# Patient Record
Sex: Male | Born: 1970 | Race: White | Hispanic: No | Marital: Single | Smoking: Current every day smoker
Health system: Southern US, Community
[De-identification: ages and names within clinical notes are randomized; demographics above are authoritative.]

## PROBLEM LIST (undated history)

## (undated) DIAGNOSIS — F419 Anxiety disorder, unspecified: Secondary | ICD-10-CM

## (undated) DIAGNOSIS — G2581 Restless legs syndrome: Secondary | ICD-10-CM

## (undated) DIAGNOSIS — F1010 Alcohol abuse, uncomplicated: Secondary | ICD-10-CM

## (undated) DIAGNOSIS — F4310 Post-traumatic stress disorder, unspecified: Secondary | ICD-10-CM

## (undated) DIAGNOSIS — R569 Unspecified convulsions: Secondary | ICD-10-CM

## (undated) DIAGNOSIS — F32A Depression, unspecified: Secondary | ICD-10-CM

## (undated) DIAGNOSIS — F329 Major depressive disorder, single episode, unspecified: Secondary | ICD-10-CM

## (undated) DIAGNOSIS — G4730 Sleep apnea, unspecified: Secondary | ICD-10-CM

## (undated) DIAGNOSIS — G4700 Insomnia, unspecified: Secondary | ICD-10-CM

## (undated) DIAGNOSIS — K259 Gastric ulcer, unspecified as acute or chronic, without hemorrhage or perforation: Secondary | ICD-10-CM

## (undated) HISTORY — PX: ABDOMINAL SURGERY: SHX537

## (undated) HISTORY — PX: KNEE SURGERY: SHX244

## (undated) HISTORY — PX: GANGLION CYST EXCISION: SHX1691

## (undated) HISTORY — DX: Alcohol abuse, uncomplicated: F10.10

---

## 1994-07-23 DIAGNOSIS — K259 Gastric ulcer, unspecified as acute or chronic, without hemorrhage or perforation: Secondary | ICD-10-CM

## 1994-07-23 HISTORY — PX: ABDOMINAL SURGERY: SHX537

## 1994-07-23 HISTORY — DX: Gastric ulcer, unspecified as acute or chronic, without hemorrhage or perforation: K25.9

## 2016-12-18 HISTORY — PX: EUS: SHX5427

## 2016-12-18 HISTORY — PX: ESOPHAGOGASTRODUODENOSCOPY: SHX1529

## 2018-05-03 ENCOUNTER — Emergency Department (HOSPITAL_COMMUNITY)
Admission: EM | Admit: 2018-05-03 | Discharge: 2018-05-03 | Disposition: A | Discharge status: Home / Self Care | Payer: Medicare Other | Attending: Emergency Medicine | Admitting: Emergency Medicine

## 2018-05-03 ENCOUNTER — Emergency Department (HOSPITAL_COMMUNITY): Payer: Medicare Other

## 2018-05-03 ENCOUNTER — Encounter (HOSPITAL_COMMUNITY): Payer: Self-pay | Admitting: Emergency Medicine

## 2018-05-03 ENCOUNTER — Other Ambulatory Visit: Payer: Self-pay

## 2018-05-03 DIAGNOSIS — F419 Anxiety disorder, unspecified: Secondary | ICD-10-CM | POA: Diagnosis present

## 2018-05-03 DIAGNOSIS — G2581 Restless legs syndrome: Secondary | ICD-10-CM | POA: Diagnosis not present

## 2018-05-03 DIAGNOSIS — F329 Major depressive disorder, single episode, unspecified: Secondary | ICD-10-CM | POA: Insufficient documentation

## 2018-05-03 DIAGNOSIS — Z87891 Personal history of nicotine dependence: Secondary | ICD-10-CM | POA: Diagnosis not present

## 2018-05-03 HISTORY — DX: Restless legs syndrome: G25.81

## 2018-05-03 HISTORY — DX: Post-traumatic stress disorder, unspecified: F43.10

## 2018-05-03 HISTORY — DX: Insomnia, unspecified: G47.00

## 2018-05-03 HISTORY — DX: Major depressive disorder, single episode, unspecified: F32.9

## 2018-05-03 HISTORY — DX: Gastric ulcer, unspecified as acute or chronic, without hemorrhage or perforation: K25.9

## 2018-05-03 HISTORY — DX: Depression, unspecified: F32.A

## 2018-05-03 HISTORY — DX: Unspecified convulsions: R56.9

## 2018-05-03 HISTORY — DX: Sleep apnea, unspecified: G47.30

## 2018-05-03 HISTORY — DX: Anxiety disorder, unspecified: F41.9

## 2018-05-03 LAB — COMPREHENSIVE METABOLIC PANEL
ALBUMIN: 4.2 g/dL (ref 3.5–5.0)
ALT: 27 U/L (ref 0–44)
ANION GAP: 10 (ref 5–15)
AST: 27 U/L (ref 15–41)
Alkaline Phosphatase: 63 U/L (ref 38–126)
BILIRUBIN TOTAL: 0.7 mg/dL (ref 0.3–1.2)
BUN: 8 mg/dL (ref 6–20)
CO2: 23 mmol/L (ref 22–32)
Calcium: 9 mg/dL (ref 8.9–10.3)
Chloride: 105 mmol/L (ref 98–111)
Creatinine, Ser: 0.63 mg/dL (ref 0.61–1.24)
GFR calc Af Amer: >60 mL/min (ref >60)
GFR calc non Af Amer: >60 mL/min (ref >60)
GLUCOSE: 95 mg/dL (ref 70–99)
Potassium: 4.3 mmol/L (ref 3.5–5.1)
Sodium: 138 mmol/L (ref 135–145)
TOTAL PROTEIN: 7.9 g/dL (ref 6.5–8.1)

## 2018-05-03 LAB — TROPONIN I: Troponin I: <0.03 ng/mL (ref <0.03)

## 2018-05-03 LAB — CBC WITH DIFFERENTIAL/PLATELET
Abs Immature Granulocytes: 0.04 10*3/uL (ref 0.00–0.07)
BASOS ABS: 0.1 10*3/uL (ref 0.0–0.1)
Basophils Relative: 1 %
EOS ABS: 0.1 10*3/uL (ref 0.0–0.5)
EOS PCT: 2 %
HCT: 44.8 % (ref 39.0–52.0)
Hemoglobin: 14.5 g/dL (ref 13.0–17.0)
IMMATURE GRANULOCYTES: 1 %
Lymphocytes Relative: 20 %
Lymphs Abs: 1.4 10*3/uL (ref 0.7–4.0)
MCH: 29.8 pg (ref 26.0–34.0)
MCHC: 32.4 g/dL (ref 30.0–36.0)
MCV: 92.2 fL (ref 80.0–100.0)
Monocytes Absolute: 0.6 10*3/uL (ref 0.1–1.0)
Monocytes Relative: 8 %
NEUTROS PCT: 68 %
NRBC: 0 % (ref 0.0–0.2)
Neutro Abs: 4.9 10*3/uL (ref 1.7–7.7)
Platelets: 299 10*3/uL (ref 150–400)
RBC: 4.86 MIL/uL (ref 4.22–5.81)
RDW: 11.7 % (ref 11.5–15.5)
WBC: 7.1 10*3/uL (ref 4.0–10.5)

## 2018-05-03 LAB — URINALYSIS, ROUTINE W REFLEX MICROSCOPIC
Bilirubin Urine: NEGATIVE
GLUCOSE, UA: NEGATIVE mg/dL
Hgb urine dipstick: NEGATIVE
Ketones, ur: NEGATIVE mg/dL
LEUKOCYTES UA: NEGATIVE
Nitrite: NEGATIVE
PROTEIN: NEGATIVE mg/dL
Specific Gravity, Urine: 1.013 (ref 1.005–1.030)
pH: 6 (ref 5.0–8.0)

## 2018-05-03 LAB — TSH: TSH: 2.164 u[IU]/mL (ref 0.350–4.500)

## 2018-05-03 MED ORDER — LORAZEPAM 1 MG PO TABS: 1.0000 mg | ORAL_TABLET | Freq: Three times a day (TID) | ORAL | 0 refills | Status: DC | PRN

## 2018-05-03 MED ORDER — ROPINIROLE HCL 0.5 MG PO TABS: 0.5000 mg | ORAL_TABLET | Freq: Two times a day (BID) | ORAL | 0 refills | Status: DC

## 2018-05-03 MED ORDER — LORAZEPAM 1 MG PO TABS
1.0000 mg | ORAL_TABLET | Freq: Once | ORAL | Status: AC
  Administered 2018-05-03: 1 mg via ORAL
  Filled 2018-05-03: qty 1

## 2018-05-03 NOTE — ED Triage Notes (Signed)
Patient c/o hypertension in which he believes is due to his insomnia, anxiety, and depression. Patient states that he used to use prescribed medications but stopped due to side effects. Patient states he tried to kill himself in his sleep while taking the medications. Patient states that he self medicates with marijuana. Patient states that he hasn't been sleeping lately and has had tremors. Patient's blood pressure last night at home 170/129. Patient states he has also had intermittent chest pain that he thought was related to the insomnia as well. Denies any chest pain at this time. Does states shortness of breath with chest pain. No cardiac hx.

## 2018-05-03 NOTE — Discharge Instructions (Signed)
Your tests were good.  Prescription for Ropinirole (restless leg syndrome) and Ativan (anxiety).  Recommend counseling.  Phone number given.

## 2018-05-03 NOTE — ED Provider Notes (Signed)
[facility] EMERGENCY DEPARTMENT Provider Note   CSN: [id number] Arrival date & time: 05/03/18  6962     History   Chief Complaint Chief Complaint  Patient presents with  . Hypertension    HPI Andrew Parks is a 47 y.o. male.  Patient presents with anxiety, insomnia, restless leg syndrome.  Blood pressure elevated last night.  No chest pain, dyspnea, fever, sweats, chills, cough, dysuria.  He does smoke 1-1/2 packs of cigarettes per day.  Drinks occasionally.  Social history: guitarist in a rock band.  He had been on ropinirole for his restless leg syndrome, but recently ran out.  He is here visiting his girlfriend.     Past Medical History:  Diagnosis Date  . Anxiety   . Depression   . Insomnia   . PTSD (post-traumatic stress disorder)   . RLS (restless legs syndrome)   . Seizures (HCC)   . Sleep apnea   . Stomach ulcer     There are no active problems to display for this patient.   Past Surgical History:  Procedure Laterality Date  . ABDOMINAL SURGERY    . GANGLION CYST EXCISION    . KNEE SURGERY          Home Medications    Prior to Admission medications   Medication Sig Start Date End Date Taking? Authorizing Provider  LORazepam (ATIVAN) 1 MG tablet Take 1 tablet (1 mg total) by mouth 3 (three) times daily as needed for anxiety. 05/03/18   Donnetta Hutching, MD  rOPINIRole (REQUIP) 0.5 MG tablet Take 1 tablet (0.5 mg total) by mouth 2 (two) times daily. 05/03/18   Donnetta Hutching, MD    Family History Family History  Problem Relation Age of Onset  . Hyperlipidemia Mother   . Hypertension Mother   . Depression Mother   . Anxiety disorder Mother     Social History Social History   Tobacco Use  . Smoking status: Former Smoker    Packs/day: 1.50    Years: 1.00    Pack years: 1.50    Types: Cigarettes    Last attempt to quit: 05/02/2018  . Smokeless tobacco: Never Used  Substance Use Topics  . Alcohol use: Yes    Comment: occasional  . Drug use: Yes      Types: Marijuana     Allergies   Patient has no known allergies.   Review of Systems Review of Systems  All other systems reviewed and are negative.    Physical Exam Updated Vital Signs BP (!) 133/94 (BP Location: Right Arm)   Pulse 72   Temp 98.1 F (36.7 C) (Oral)   Resp (!) 25   Ht 5\' 8"  (1.727 m)   Wt 79.4 kg   SpO2 97%   BMI 26.61 kg/m   Physical Exam  Constitutional: He is oriented to person, place, and time. He appears well-developed and well-nourished.  HENT:  Head: Normocephalic and atraumatic.  Eyes: Conjunctivae are normal.  Neck: Neck supple.  Cardiovascular: Normal rate and regular rhythm.  Pulmonary/Chest: Effort normal and breath sounds normal.  Abdominal: Soft. Bowel sounds are normal.  Musculoskeletal: Normal range of motion.  Neurological: He is alert and oriented to person, place, and time.  Skin: Skin is warm and dry.  Psychiatric: He has a normal mood and affect. His behavior is normal.  Nursing note and vitals reviewed.    ED Treatments / Results  Labs (all labs ordered are listed, but only abnormal results are displayed) Labs  Reviewed  CBC WITH DIFFERENTIAL/PLATELET  COMPREHENSIVE METABOLIC PANEL  URINALYSIS, ROUTINE W REFLEX MICROSCOPIC  TROPONIN I  TSH    EKG EKG Interpretation  Date/Time:  Saturday May 03 2018 10:17:49 EDT Ventricular Rate:  65 PR Interval:    QRS Duration: 78 QT Interval:  386 QTC Calculation: 402 R Axis:   64 Text Interpretation:  Sinus rhythm RSR' in V1 or V2, probably normal variant ST elev, probable normal early repol pattern Confirmed by Donnetta Hutching ([id number]) on 05/03/2018 11:48:48 AM   Radiology Dg Chest 2 View  Result Date: 05/03/2018 CLINICAL DATA:  Anxiety, chest discomfort, hypertension, shortness of breath. EXAM: CHEST - 2 VIEW COMPARISON:  None. FINDINGS: Cardiomediastinal silhouette is within normal limits in size and configuration. Lungs are clear. Lung volumes are normal. No  evidence of pneumonia. No pleural effusion. No pneumothorax seen. Osseous and soft tissue structures about the chest are unremarkable. IMPRESSION: No active cardiopulmonary disease. Electronically Signed   By: Bary Richard M.D.   On: 05/03/2018 11:27    Procedures Procedures (including critical care time)  Medications Ordered in ED Medications  LORazepam (ATIVAN) tablet 1 mg (1 mg Oral Given 05/03/18 1359)     Initial Impression / Assessment and Plan / ED Course  I have reviewed the triage vital signs and the nursing notes.  Pertinent labs & imaging results that were available during my care of the patient were reviewed by me and considered in my medical decision making (see chart for details).     Patient presents with anxiety and restless legs.  His vital signs were stable.  Screening labs including EKG, CBC, BMET, TSH were all acceptable.  Will refill his ropinirole for one month.  Also Rx Ativan 1 mg [#15].  Discussed findings with the patient and his significant other.  Final Clinical Impressions(s) / ED Diagnoses   Final diagnoses:  Anxiety  Restless leg syndrome    ED Discharge Orders         Ordered    LORazepam (ATIVAN) 1 MG tablet  3 times daily PRN     05/03/18 1409    rOPINIRole (REQUIP) 0.5 MG tablet  2 times daily     05/03/18 1409           Donnetta Hutching, MD 05/03/18 1456

## 2018-05-20 ENCOUNTER — Other Ambulatory Visit: Payer: Self-pay

## 2018-05-20 ENCOUNTER — Emergency Department (HOSPITAL_COMMUNITY)
Admission: EM | Admit: 2018-05-20 | Discharge: 2018-05-20 | Disposition: A | Discharge status: Home / Self Care | Payer: Medicare Other | Attending: Emergency Medicine | Admitting: Emergency Medicine

## 2018-05-20 ENCOUNTER — Encounter (HOSPITAL_COMMUNITY): Payer: Self-pay

## 2018-05-20 DIAGNOSIS — R002 Palpitations: Secondary | ICD-10-CM | POA: Insufficient documentation

## 2018-05-20 DIAGNOSIS — G4709 Other insomnia: Secondary | ICD-10-CM | POA: Diagnosis not present

## 2018-05-20 DIAGNOSIS — R251 Tremor, unspecified: Secondary | ICD-10-CM | POA: Insufficient documentation

## 2018-05-20 DIAGNOSIS — F419 Anxiety disorder, unspecified: Secondary | ICD-10-CM | POA: Insufficient documentation

## 2018-05-20 DIAGNOSIS — F17210 Nicotine dependence, cigarettes, uncomplicated: Secondary | ICD-10-CM | POA: Insufficient documentation

## 2018-05-20 DIAGNOSIS — F1210 Cannabis abuse, uncomplicated: Secondary | ICD-10-CM | POA: Diagnosis not present

## 2018-05-20 MED ORDER — ROPINIROLE HCL 0.5 MG PO TABS: 0.5000 mg | ORAL_TABLET | Freq: Two times a day (BID) | ORAL | 0 refills | Status: DC

## 2018-05-20 MED ORDER — LORAZEPAM 1 MG PO TABS
1.0000 mg | ORAL_TABLET | Freq: Once | ORAL | Status: AC
  Administered 2018-05-20: 1 mg via ORAL
  Filled 2018-05-20: qty 1

## 2018-05-20 MED ORDER — HYDROXYZINE HCL 25 MG PO TABS: 25.0000 mg | ORAL_TABLET | Freq: Four times a day (QID) | ORAL | 0 refills | Status: DC | PRN

## 2018-05-20 NOTE — ED Triage Notes (Signed)
Patient c/o insomnia and anxiety. Reports elevated bp prior to arrival, reports marijuana use to self medicate.reports last use 1 to 2 days ago.

## 2018-05-20 NOTE — ED Provider Notes (Signed)
[facility] EMERGENCY DEPARTMENT Provider Note   CSN: [id number] Arrival date & time: 05/20/18  7829     History   Chief Complaint Chief Complaint  Patient presents with  . Anxiety  . Insomnia    HPI Andrew Parks is a 47 y.o. male.  HPI  Is a 47 year old male with a history of anxiety who presents with increased anxious feeling.  Patient reports that he has issues with daily bouts of anxiety.  He smokes marijuana to control his anxiety but states that sometimes it is too cost prohibitive.  Today he states that he was unable to sleep and had a panic attack.  He reports increased stress at home.  He has previously had a short course of Ativan which seemed to help but he no longer has this.  He also reports being on some additional anxiety medication but "I had an adverse reaction."  Patient reports symptoms of shakiness and palpitations.  No chest pain, shortness of breath.  Patient does have a history of restless leg syndrome reports that that bothers him as well.  During this attack, his wife took his blood pressure and noted it to be elevated into the 170s.  Denies any alcohol or other illicit drug use.  Past Medical History:  Diagnosis Date  . Anxiety   . Depression   . Insomnia   . PTSD (post-traumatic stress disorder)   . RLS (restless legs syndrome)   . Seizures (HCC)   . Sleep apnea   . Stomach ulcer     There are no active problems to display for this patient.   Past Surgical History:  Procedure Laterality Date  . ABDOMINAL SURGERY    . GANGLION CYST EXCISION    . KNEE SURGERY          Home Medications    Prior to Admission medications   Medication Sig Start Date End Date Taking? Authorizing Provider  hydrOXYzine (ATARAX/VISTARIL) 25 MG tablet Take 1 tablet (25 mg total) by mouth every 6 (six) hours as needed for anxiety. 05/20/18   Sukhman Martine, Mayer Masker, MD  LORazepam (ATIVAN) 1 MG tablet Take 1 tablet (1 mg total) by mouth 3 (three) times daily as needed for  anxiety. 05/03/18   Donnetta Hutching, MD  rOPINIRole (REQUIP) 0.5 MG tablet Take 1 tablet (0.5 mg total) by mouth 2 (two) times daily. 05/20/18   Lambros Cerro, Mayer Masker, MD    Family History Family History  Problem Relation Age of Onset  . Hyperlipidemia Mother   . Hypertension Mother   . Depression Mother   . Anxiety disorder Mother     Social History Social History   Tobacco Use  . Smoking status: Current Every Day Smoker    Packs/day: 1.00    Years: 1.00    Pack years: 1.00    Types: Cigarettes    Last attempt to quit: 05/02/2018    Years since quitting: 0.0  . Smokeless tobacco: Never Used  Substance Use Topics  . Alcohol use: Yes    Comment: occasional  . Drug use: Yes    Types: Marijuana     Allergies   Patient has no known allergies.   Review of Systems Review of Systems  Constitutional: Negative for fever.  Respiratory: Negative for shortness of breath.   Cardiovascular: Positive for palpitations. Negative for chest pain.  Gastrointestinal: Negative for nausea and vomiting.  Neurological: Positive for tremors.  Psychiatric/Behavioral: The patient is nervous/anxious.   All other systems reviewed and are  negative.    Physical Exam Updated Vital Signs BP 134/89 (BP Location: Left Arm)   Pulse 83   Temp 98.4 F (36.9 C) (Oral)   Resp 18   Ht 1.727 m (5\' 8" )   Wt 79.4 kg   SpO2 99%   BMI 26.61 kg/m   Physical Exam  Constitutional: He is oriented to person, place, and time. He appears well-developed and well-nourished. No distress.  HENT:  Head: Normocephalic and atraumatic.  Eyes: Pupils are equal, round, and reactive to light.  Cardiovascular: Normal rate, regular rhythm and normal heart sounds.  No murmur heard. Pulmonary/Chest: Effort normal and breath sounds normal. No respiratory distress. He has no wheezes.  Abdominal: Soft. Bowel sounds are normal. There is no tenderness. There is no rebound.  Musculoskeletal: He exhibits no edema.    Neurological: He is alert and oriented to person, place, and time.  Skin: Skin is warm and dry.  Psychiatric: He has a normal mood and affect.  Nursing note and vitals reviewed.    ED Treatments / Results  Labs (all labs ordered are listed, but only abnormal results are displayed) Labs Reviewed - No data to display  EKG EKG Interpretation  Date/Time:  Tuesday May 20 2018 03:23:32 EDT Ventricular Rate:  85 PR Interval:    QRS Duration: 91 QT Interval:  364 QTC Calculation: 433 R Axis:   49 Text Interpretation:  Sinus rhythm Abnormal R-wave progression, early transition Confirmed by Ross Marcus ([id number]) on 05/20/2018 3:26:01 AM   Radiology No results found.  Procedures Procedures (including critical care time)  Medications Ordered in ED Medications  LORazepam (ATIVAN) tablet 1 mg (1 mg Oral Given 05/20/18 0325)     Initial Impression / Assessment and Plan / ED Course  I have reviewed the triage vital signs and the nursing notes.  Pertinent labs & imaging results that were available during my care of the patient were reviewed by me and considered in my medical decision making (see chart for details).     Patient presents with anxiety and difficulty sleeping.  He has been seen here for the same.  He was given a short course of Ativan and a refill on his Requip.  He reports that he no longer has these medications.  He is nontoxic on exam and vital signs are reassuring.  EKG shows no signs of arrhythmia.  I discussed with patient that he needs to follow-up closely with his primary physician and/or therapist regarding his ongoing anxiety.  Ativan is not an appropriate treatment in the long-term.  He was given 1 dose of Ativan here.  Will discharge home with Vistaril and a refill for his Requip.  After history, exam, and medical workup I feel the patient has been appropriately medically screened and is safe for discharge home. Pertinent diagnoses were discussed with  the patient. Patient was given return precautions.   Final Clinical Impressions(s) / ED Diagnoses   Final diagnoses:  Anxiety  Other insomnia    ED Discharge Orders         Ordered    rOPINIRole (REQUIP) 0.5 MG tablet  2 times daily     05/20/18 0327    hydrOXYzine (ATARAX/VISTARIL) 25 MG tablet  Every 6 hours PRN     05/20/18 0328           Shon Baton, MD 05/20/18 [phone]

## 2018-05-20 NOTE — Discharge Instructions (Addendum)
You were seen today for anxiety and insomnia.  You need to follow-up closely with your primary physician for ongoing medication needs and options regarding your ongoing anxiety.  Your Requip was refilled.  You may trial hydroxyzine for anxiety to see if this helps.  If you have thoughts of wanting to hurt yourself or someone else, worsening symptoms, you should be reevaluated.

## 2018-08-14 ENCOUNTER — Encounter (HOSPITAL_COMMUNITY): Payer: Self-pay | Admitting: Emergency Medicine

## 2018-08-14 ENCOUNTER — Inpatient Hospital Stay (HOSPITAL_COMMUNITY)
Admission: EM | Admit: 2018-08-14 | Discharge: 2018-08-17 | DRG: 897 | Disposition: A | Discharge status: Home / Self Care | Payer: Medicare Other | Attending: Internal Medicine | Admitting: Internal Medicine

## 2018-08-14 DIAGNOSIS — Z8711 Personal history of peptic ulcer disease: Secondary | ICD-10-CM

## 2018-08-14 DIAGNOSIS — F10239 Alcohol dependence with withdrawal, unspecified: Secondary | ICD-10-CM | POA: Diagnosis not present

## 2018-08-14 DIAGNOSIS — F10939 Alcohol use, unspecified with withdrawal, unspecified: Secondary | ICD-10-CM | POA: Diagnosis present

## 2018-08-14 DIAGNOSIS — Z91128 Patient's intentional underdosing of medication regimen for other reason: Secondary | ICD-10-CM

## 2018-08-14 DIAGNOSIS — F1910 Other psychoactive substance abuse, uncomplicated: Secondary | ICD-10-CM | POA: Diagnosis present

## 2018-08-14 DIAGNOSIS — G40909 Epilepsy, unspecified, not intractable, without status epilepticus: Secondary | ICD-10-CM | POA: Diagnosis present

## 2018-08-14 DIAGNOSIS — G2581 Restless legs syndrome: Secondary | ICD-10-CM | POA: Diagnosis present

## 2018-08-14 DIAGNOSIS — F10231 Alcohol dependence with withdrawal delirium: Secondary | ICD-10-CM | POA: Diagnosis not present

## 2018-08-14 DIAGNOSIS — E876 Hypokalemia: Secondary | ICD-10-CM | POA: Diagnosis not present

## 2018-08-14 DIAGNOSIS — F4310 Post-traumatic stress disorder, unspecified: Secondary | ICD-10-CM | POA: Diagnosis present

## 2018-08-14 DIAGNOSIS — F10229 Alcohol dependence with intoxication, unspecified: Secondary | ICD-10-CM | POA: Diagnosis present

## 2018-08-14 DIAGNOSIS — Z79899 Other long term (current) drug therapy: Secondary | ICD-10-CM

## 2018-08-14 DIAGNOSIS — Y92009 Unspecified place in unspecified non-institutional (private) residence as the place of occurrence of the external cause: Secondary | ICD-10-CM

## 2018-08-14 DIAGNOSIS — Z87891 Personal history of nicotine dependence: Secondary | ICD-10-CM

## 2018-08-14 DIAGNOSIS — F329 Major depressive disorder, single episode, unspecified: Secondary | ICD-10-CM | POA: Diagnosis present

## 2018-08-14 DIAGNOSIS — Z9114 Patient's other noncompliance with medication regimen: Secondary | ICD-10-CM

## 2018-08-14 DIAGNOSIS — F10931 Alcohol use, unspecified with withdrawal delirium: Secondary | ICD-10-CM

## 2018-08-14 DIAGNOSIS — T4276XA Underdosing of unspecified antiepileptic and sedative-hypnotic drugs, initial encounter: Secondary | ICD-10-CM | POA: Diagnosis present

## 2018-08-14 DIAGNOSIS — Z818 Family history of other mental and behavioral disorders: Secondary | ICD-10-CM

## 2018-08-14 DIAGNOSIS — F1223 Cannabis dependence with withdrawal: Secondary | ICD-10-CM | POA: Diagnosis present

## 2018-08-14 DIAGNOSIS — F5104 Psychophysiologic insomnia: Secondary | ICD-10-CM | POA: Diagnosis present

## 2018-08-14 DIAGNOSIS — F419 Anxiety disorder, unspecified: Secondary | ICD-10-CM | POA: Diagnosis present

## 2018-08-14 DIAGNOSIS — G4700 Insomnia, unspecified: Secondary | ICD-10-CM | POA: Diagnosis present

## 2018-08-14 DIAGNOSIS — F10230 Alcohol dependence with withdrawal, uncomplicated: Secondary | ICD-10-CM | POA: Diagnosis not present

## 2018-08-14 LAB — URINALYSIS, ROUTINE W REFLEX MICROSCOPIC
Bilirubin Urine: NEGATIVE
GLUCOSE, UA: NEGATIVE mg/dL
KETONES UR: 5 mg/dL — AB
LEUKOCYTES UA: NEGATIVE
NITRITE: NEGATIVE
Protein, ur: 100 mg/dL — AB
Specific Gravity, Urine: 1.026 (ref 1.005–1.030)
pH: 6 (ref 5.0–8.0)

## 2018-08-14 LAB — COMPREHENSIVE METABOLIC PANEL
ALT: 44 U/L (ref 0–44)
ANION GAP: 14 (ref 5–15)
AST: 62 U/L — ABNORMAL HIGH (ref 15–41)
Albumin: 4.2 g/dL (ref 3.5–5.0)
Alkaline Phosphatase: 84 U/L (ref 38–126)
BILIRUBIN TOTAL: 1.3 mg/dL — AB (ref 0.3–1.2)
BUN: 13 mg/dL (ref 6–20)
CALCIUM: 9 mg/dL (ref 8.9–10.3)
CO2: 21 mmol/L — ABNORMAL LOW (ref 22–32)
Chloride: 98 mmol/L (ref 98–111)
Creatinine, Ser: 0.63 mg/dL (ref 0.61–1.24)
GFR calc Af Amer: >60 mL/min (ref >60)
Glucose, Bld: 102 mg/dL — ABNORMAL HIGH (ref 70–99)
POTASSIUM: 3.6 mmol/L (ref 3.5–5.1)
Sodium: 133 mmol/L — ABNORMAL LOW (ref 135–145)
TOTAL PROTEIN: 8.6 g/dL — AB (ref 6.5–8.1)

## 2018-08-14 LAB — MAGNESIUM: Magnesium: 1.8 mg/dL (ref 1.7–2.4)

## 2018-08-14 LAB — RAPID URINE DRUG SCREEN, HOSP PERFORMED
AMPHETAMINES: NOT DETECTED
BARBITURATES: NOT DETECTED
BENZODIAZEPINES: NOT DETECTED
Cocaine: NOT DETECTED
Opiates: NOT DETECTED
Tetrahydrocannabinol: POSITIVE — AB

## 2018-08-14 LAB — CBC WITH DIFFERENTIAL/PLATELET
ABS IMMATURE GRANULOCYTES: 0.04 10*3/uL (ref 0.00–0.07)
Basophils Absolute: 0 10*3/uL (ref 0.0–0.1)
Basophils Relative: 0 %
EOS ABS: 0 10*3/uL (ref 0.0–0.5)
Eosinophils Relative: 0 %
HEMATOCRIT: 45.5 % (ref 39.0–52.0)
HEMOGLOBIN: 15.7 g/dL (ref 13.0–17.0)
IMMATURE GRANULOCYTES: 0 %
LYMPHS ABS: 1.1 10*3/uL (ref 0.7–4.0)
LYMPHS PCT: 9 %
MCH: 30.9 pg (ref 26.0–34.0)
MCHC: 34.5 g/dL (ref 30.0–36.0)
MCV: 89.6 fL (ref 80.0–100.0)
MONOS PCT: 5 %
Monocytes Absolute: 0.6 10*3/uL (ref 0.1–1.0)
NEUTROS ABS: 10.2 10*3/uL — AB (ref 1.7–7.7)
NEUTROS PCT: 86 %
Platelets: 213 10*3/uL (ref 150–400)
RBC: 5.08 MIL/uL (ref 4.22–5.81)
RDW: 11.4 % — AB (ref 11.5–15.5)
WBC: 12 10*3/uL — AB (ref 4.0–10.5)
nRBC: 0 % (ref 0.0–0.2)

## 2018-08-14 LAB — ETHANOL: Alcohol, Ethyl (B): <10 mg/dL (ref <10)

## 2018-08-14 MED ORDER — FOLIC ACID 5 MG/ML IJ SOLN
INTRAMUSCULAR | Status: AC
  Filled 2018-08-14: qty 0.2

## 2018-08-14 MED ORDER — LORAZEPAM 1 MG PO TABS
0.0000 mg | ORAL_TABLET | Freq: Two times a day (BID) | ORAL | Status: DC
  Administered 2018-08-16 – 2018-08-17 (×2): 1 mg via ORAL
  Filled 2018-08-14 (×2): qty 1

## 2018-08-14 MED ORDER — LORAZEPAM 2 MG/ML IJ SOLN: 0.0000 mg | Freq: Two times a day (BID) | INTRAMUSCULAR | Status: DC

## 2018-08-14 MED ORDER — ACETAMINOPHEN 325 MG PO TABS: 650.0000 mg | ORAL_TABLET | Freq: Four times a day (QID) | ORAL | Status: DC | PRN

## 2018-08-14 MED ORDER — LORAZEPAM 2 MG/ML IJ SOLN
0.0000 mg | Freq: Four times a day (QID) | INTRAMUSCULAR | Status: DC
  Administered 2018-08-14: 1 mg via INTRAVENOUS
  Filled 2018-08-14: qty 1

## 2018-08-14 MED ORDER — LORAZEPAM 1 MG PO TABS
0.0000 mg | ORAL_TABLET | Freq: Four times a day (QID) | ORAL | Status: AC
  Administered 2018-08-15: 3 mg via ORAL
  Administered 2018-08-15 (×2): 1 mg via ORAL
  Administered 2018-08-16: 3 mg via ORAL
  Administered 2018-08-16 (×2): 1 mg via ORAL
  Filled 2018-08-14: qty 1
  Filled 2018-08-14 (×3): qty 3
  Filled 2018-08-14 (×2): qty 1

## 2018-08-14 MED ORDER — THIAMINE HCL 100 MG/ML IJ SOLN
100.0000 mg | Freq: Every day | INTRAMUSCULAR | Status: DC
  Administered 2018-08-14: 100 mg via INTRAVENOUS
  Filled 2018-08-14: qty 2

## 2018-08-14 MED ORDER — ONDANSETRON HCL 4 MG PO TABS: 4.0000 mg | ORAL_TABLET | Freq: Four times a day (QID) | ORAL | Status: DC | PRN

## 2018-08-14 MED ORDER — POLYETHYLENE GLYCOL 3350 17 G PO PACK: 17.0000 g | PACK | Freq: Every day | ORAL | Status: DC | PRN

## 2018-08-14 MED ORDER — THIAMINE HCL 100 MG/ML IJ SOLN
INTRAMUSCULAR | Status: AC
  Filled 2018-08-14: qty 2

## 2018-08-14 MED ORDER — LORAZEPAM 1 MG PO TABS: 0.0000 mg | ORAL_TABLET | Freq: Two times a day (BID) | ORAL | Status: DC

## 2018-08-14 MED ORDER — FOLIC ACID 1 MG PO TABS
1.0000 mg | ORAL_TABLET | Freq: Every day | ORAL | Status: DC
  Administered 2018-08-15 – 2018-08-17 (×3): 1 mg via ORAL
  Filled 2018-08-14 (×3): qty 1

## 2018-08-14 MED ORDER — SODIUM CHLORIDE 0.9 % IV BOLUS
1000.0000 mL | Freq: Once | INTRAVENOUS | Status: AC
  Administered 2018-08-14: 1000 mL via INTRAVENOUS

## 2018-08-14 MED ORDER — ONDANSETRON HCL 4 MG/2ML IJ SOLN: 4.0000 mg | Freq: Four times a day (QID) | INTRAMUSCULAR | Status: DC | PRN

## 2018-08-14 MED ORDER — ACETAMINOPHEN 650 MG RE SUPP: 650.0000 mg | Freq: Four times a day (QID) | RECTAL | Status: DC | PRN

## 2018-08-14 MED ORDER — ROPINIROLE HCL 0.25 MG PO TABS
0.5000 mg | ORAL_TABLET | Freq: Two times a day (BID) | ORAL | Status: DC
  Administered 2018-08-15 – 2018-08-17 (×5): 0.5 mg via ORAL
  Filled 2018-08-14: qty 2
  Filled 2018-08-14: qty 1
  Filled 2018-08-14 (×4): qty 2

## 2018-08-14 MED ORDER — VITAMIN B-1 100 MG PO TABS: 100.0000 mg | ORAL_TABLET | Freq: Every day | ORAL | Status: DC

## 2018-08-14 MED ORDER — THIAMINE HCL 100 MG/ML IJ SOLN
Freq: Once | INTRAVENOUS | Status: AC
  Administered 2018-08-15: 01:00:00 via INTRAVENOUS
  Filled 2018-08-14: qty 1000

## 2018-08-14 MED ORDER — LORAZEPAM 2 MG/ML IJ SOLN
1.0000 mg | Freq: Four times a day (QID) | INTRAMUSCULAR | Status: DC | PRN
  Administered 2018-08-15 – 2018-08-17 (×3): 1 mg via INTRAVENOUS
  Filled 2018-08-14 (×3): qty 1

## 2018-08-14 MED ORDER — LORAZEPAM 1 MG PO TABS
1.0000 mg | ORAL_TABLET | Freq: Four times a day (QID) | ORAL | Status: DC | PRN
  Administered 2018-08-15: 1 mg via ORAL
  Filled 2018-08-14 (×2): qty 1

## 2018-08-14 MED ORDER — VITAMIN B-1 100 MG PO TABS
100.0000 mg | ORAL_TABLET | Freq: Every day | ORAL | Status: DC
  Administered 2018-08-15 – 2018-08-17 (×3): 100 mg via ORAL
  Filled 2018-08-14 (×3): qty 1

## 2018-08-14 MED ORDER — THIAMINE HCL 100 MG/ML IJ SOLN: 100.0000 mg | Freq: Every day | INTRAMUSCULAR | Status: DC

## 2018-08-14 MED ORDER — ADULT MULTIVITAMIN W/MINERALS CH
1.0000 | ORAL_TABLET | Freq: Every day | ORAL | Status: DC
  Administered 2018-08-15 – 2018-08-17 (×3): 1 via ORAL
  Filled 2018-08-14 (×2): qty 1

## 2018-08-14 MED ORDER — SODIUM CHLORIDE 0.9% FLUSH
3.0000 mL | Freq: Two times a day (BID) | INTRAVENOUS | Status: DC
  Administered 2018-08-15 – 2018-08-17 (×4): 3 mL via INTRAVENOUS

## 2018-08-14 MED ORDER — ENOXAPARIN SODIUM 40 MG/0.4ML ~~LOC~~ SOLN
40.0000 mg | SUBCUTANEOUS | Status: DC
  Administered 2018-08-15 – 2018-08-16 (×3): 40 mg via SUBCUTANEOUS
  Filled 2018-08-14 (×3): qty 0.4

## 2018-08-14 MED ORDER — M.V.I. ADULT IV INJ
INJECTION | INTRAVENOUS | Status: AC
  Filled 2018-08-14: qty 10

## 2018-08-14 MED ORDER — LORAZEPAM 2 MG/ML IJ SOLN
1.0000 mg | Freq: Once | INTRAMUSCULAR | Status: AC
  Administered 2018-08-14: 1 mg via INTRAVENOUS
  Filled 2018-08-14: qty 1

## 2018-08-14 MED ORDER — LORAZEPAM 1 MG PO TABS: 0.0000 mg | ORAL_TABLET | Freq: Four times a day (QID) | ORAL | Status: DC

## 2018-08-14 NOTE — ED Triage Notes (Signed)
PT called EMS today due to shanking/tremors/clammy from alcohol withdrawal. PT states he last drank yesterday and hasn't been able to sleep for 2 days.

## 2018-08-14 NOTE — ED Provider Notes (Signed)
[facility] EMERGENCY DEPARTMENT Provider Note   CSN: [id number] Arrival date & time: 08/14/18  1828     History   Chief Complaint Chief Complaint  Patient presents with  . Alcohol Intoxication    HPI Andrew Parks is a 48 y.o. male.  HPI Patient presents with concern of anxiousness, insomnia. Patient is here with his mother who assists with the HPI. Patient notes that in the past week, with lack of access to cannabis, his preferred method for treating his insomnia, he has been drinking alcohol in substantial quantities. Regardless, the patient remains sleepless, and now has shaking, anxiousness, generalized discomfort. No physical pain, no suicidal, no homicidal ideation. Patient knowledges history of anxiety, PTSD, denies medical problems.  Past Medical History:  Diagnosis Date  . Anxiety   . Depression   . Insomnia   . PTSD (post-traumatic stress disorder)   . RLS (restless legs syndrome)   . Seizures (HCC)   . Sleep apnea   . Stomach ulcer     There are no active problems to display for this patient.   Past Surgical History:  Procedure Laterality Date  . ABDOMINAL SURGERY    . GANGLION CYST EXCISION    . KNEE SURGERY          Home Medications    Prior to Admission medications   Medication Sig Start Date End Date Taking? Authorizing Provider  rOPINIRole (REQUIP) 0.5 MG tablet Take 1 tablet (0.5 mg total) by mouth 2 (two) times daily. Patient taking differently: Take 3 mg by mouth daily.  05/20/18  Yes Horton, Mayer Maskerourtney F, MD  hydrOXYzine (ATARAX/VISTARIL) 25 MG tablet Take 1 tablet (25 mg total) by mouth every 6 (six) hours as needed for anxiety. Patient not taking: Reported on 08/14/2018 05/20/18   Horton, Mayer Maskerourtney F, MD  LORazepam (ATIVAN) 1 MG tablet Take 1 tablet (1 mg total) by mouth 3 (three) times daily as needed for anxiety. Patient not taking: Reported on 08/14/2018 05/03/18   Donnetta Hutchingook, Brian, MD    Family History Family History  Problem Relation  Age of Onset  . Hyperlipidemia Mother   . Hypertension Mother   . Depression Mother   . Anxiety disorder Mother     Social History Social History   Tobacco Use  . Smoking status: Current Every Day Smoker    Packs/day: 1.00    Years: 1.00    Pack years: 1.00    Types: Cigarettes    Last attempt to quit: 05/02/2018    Years since quitting: 0.2  . Smokeless tobacco: Never Used  Substance Use Topics  . Alcohol use: Yes    Comment: occasional  . Drug use: Yes    Types: Marijuana     Allergies   Patient has no known allergies.   Review of Systems Review of Systems  Constitutional:       Per HPI, otherwise negative  HENT:       Per HPI, otherwise negative  Respiratory:       Per HPI, otherwise negative  Cardiovascular:       Per HPI, otherwise negative  Gastrointestinal: Negative for vomiting.  Endocrine:       Negative aside from HPI  Genitourinary:       Neg aside from HPI   Musculoskeletal:       Per HPI, otherwise negative  Skin: Negative.   Neurological: Negative for syncope.  Psychiatric/Behavioral: Positive for decreased concentration, dysphoric mood and sleep disturbance. Negative for suicidal ideas. The patient  is nervous/anxious.      Physical Exam Updated Vital Signs BP 127/84   Pulse (!) 122   Temp 98.8 F (37.1 C) (Oral)   Resp 18   Ht 5\' 8"  (1.727 m)   Wt 77.6 kg   SpO2 96%   BMI 26.00 kg/m   Physical Exam Vitals signs and nursing note reviewed.  Constitutional:      General: He is not in acute distress.    Appearance: He is well-developed.     Comments: Adult male, shaking.Uncomfortable appearing  HENT:     Head: Normocephalic and atraumatic.  Eyes:     Conjunctiva/sclera: Conjunctivae normal.  Cardiovascular:     Rate and Rhythm: Normal rate and regular rhythm.  Pulmonary:     Effort: Pulmonary effort is normal. No respiratory distress.     Breath sounds: No stridor.  Abdominal:     General: There is no distension.  Skin:     General: Skin is warm and dry.  Neurological:     Mental Status: He is alert and oriented to person, place, and time.  Psychiatric:        Mood and Affect: Mood is anxious.        Thought Content: Thought content is not paranoid or delusional. Thought content does not include homicidal or suicidal ideation. Thought content does not include homicidal or suicidal plan.      ED Treatments / Results  Labs (all labs ordered are listed, but only abnormal results are displayed) Labs Reviewed  COMPREHENSIVE METABOLIC PANEL - Abnormal; Notable for the following components:      Result Value   Sodium 133 (*)    CO2 21 (*)    Glucose, Bld 102 (*)    Total Protein 8.6 (*)    AST 62 (*)    Total Bilirubin 1.3 (*)    All other components within normal limits  CBC WITH DIFFERENTIAL/PLATELET - Abnormal; Notable for the following components:   WBC 12.0 (*)    RDW 11.4 (*)    Neutro Abs 10.2 (*)    All other components within normal limits  URINALYSIS, ROUTINE W REFLEX MICROSCOPIC - Abnormal; Notable for the following components:   Color, Urine AMBER (*)    APPearance HAZY (*)    Hgb urine dipstick SMALL (*)    Ketones, ur 5 (*)    Protein, ur 100 (*)    Bacteria, UA RARE (*)    All other components within normal limits  RAPID URINE DRUG SCREEN, HOSP PERFORMED - Abnormal; Notable for the following components:   Tetrahydrocannabinol POSITIVE (*)    All other components within normal limits  ETHANOL  MAGNESIUM    EKG None  Radiology No results found.  Procedures Procedures (including critical care time)  Medications Ordered in ED Medications  sodium chloride 0.9 % bolus 1,000 mL (1,000 mLs Intravenous New Bag/Given 08/14/18 2047)  LORazepam (ATIVAN) injection 0-4 mg (1 mg Intravenous Given 08/14/18 2027)    Or  LORazepam (ATIVAN) tablet 0-4 mg ( Oral See Alternative 08/14/18 2027)  LORazepam (ATIVAN) injection 0-4 mg (has no administration in time range)    Or  LORazepam  (ATIVAN) tablet 0-4 mg (has no administration in time range)  thiamine (VITAMIN B-1) tablet 100 mg ( Oral See Alternative 08/14/18 2102)    Or  thiamine (B-1) injection 100 mg (100 mg Intravenous Given 08/14/18 2102)  LORazepam (ATIVAN) injection 1 mg (1 mg Intravenous Given 08/14/18 1926)     Initial  Impression / Assessment and Plan / ED Course  I have reviewed the triage vital signs and the nursing notes.  Pertinent labs & imaging results that were available during my care of the patient were reviewed by me and considered in my medical decision making (see chart for details).     9:19 PM Patient streaking has diminished somewhat, but he remains tachycardic. Patient has received multiple boluses of fluids, multiple boluses of Ativan, is on the CIWA protocol. This with history of anxiety presents with tremors, tachycardia, is found to have findings consistent with acute alcohol withdrawal, with tremulousness, persistent abnormal vital signs. Patient required initiation of titrated Ativan injections for care, will add IV fluids. With concern for acute alcohol withdrawal the patient was admitted for further monitoring and management.  Final Clinical Impressions(s) / ED Diagnoses  Acute alcohol withdrawal CRITICAL CARE Performed by: Gerhard Munchobert Latanya Hemmer Total critical care time: 35 minutes Critical care time was exclusive of separately billable procedures and treating other patients. Critical care was necessary to treat or prevent imminent or life-threatening deterioration. Critical care was time spent personally by me on the following activities: development of treatment plan with patient and/or surrogate as well as nursing, discussions with consultants, evaluation of patient's response to treatment, examination of patient, obtaining history from patient or surrogate, ordering and performing treatments and interventions, ordering and review of laboratory studies, ordering and review of  radiographic studies, pulse oximetry and re-evaluation of patient's condition.    Gerhard MunchLockwood, Denym Rahimi, MD 08/14/18 2121

## 2018-08-14 NOTE — H&P (Signed)
History and Physical    Andrew Parks Tenaglia ION:[id number] DOB: 06/21/1971 DOA: 08/14/2018  PCP: Patient, No Pcp Per   Patient coming from: Home   Chief Complaint: Tremor, anxiety, malaise   HPI: Andrew Parks Coor is a 48 y.o. male with medical history significant for anxiety, insomnia, and seizure disorder, now presenting to the emergency department for evaluation of insomnia, tremors, and general malaise.  Patient reports a long history of anxiety which she manages mainly with THC. Reports that he has not had access to marijuana recently and he has been attempting to manage his anxiety and insomnia with alcohol. Despite drinking excessively, he is still having difficulty sleeping and has now developed tremors with increased anxiety, general malaise, and reports feeling "clammy."  Last drink was yesterday.  Reports that he has "mild" seizures every couple days, most recently 2 days ago, and states that he no longer takes any antiepileptics because it made him worse.  He has recently moved to the state and does not have a neurologist here.  Denies history of alcohol-related seizures.  Denies recent fevers, chills, chest pain, shortness of breath, cough, abdominal pain, melena, or hematochezia.  No headache, change in vision or hearing, or focal numbness or weakness.  ED Course: Upon arrival to the ED, patient is found to be afebrile, saturating well on room air, tachycardic to the 130s, and vitals otherwise stable.  Chemistry panel is notable for sodium of 133 and CBC features a leukocytosis to 12,000.  UDS positive for THC and ethanol level undetectable.  Patient was given a liter of normal saline and Ativan in the ED.  He remains tachycardic and tremulous and will be observed for further evaluation and management of acute alcohol withdrawal.  Review of Systems:  All other systems reviewed and apart from HPI, are negative.  Past Medical History:  Diagnosis Date  . Anxiety   . Depression   . Insomnia   . PTSD  (post-traumatic stress disorder)   . RLS (restless legs syndrome)   . Seizures (HCC)   . Sleep apnea   . Stomach ulcer     Past Surgical History:  Procedure Laterality Date  . ABDOMINAL SURGERY    . GANGLION CYST EXCISION    . KNEE SURGERY       reports that he has been smoking cigarettes. He has a 1.00 pack-year smoking history. He has never used smokeless tobacco. He reports current alcohol use. He reports current drug use. Drug: Marijuana.  No Known Allergies  Family History  Problem Relation Age of Onset  . Hyperlipidemia Mother   . Hypertension Mother   . Depression Mother   . Anxiety disorder Mother      Prior to Admission medications   Medication Sig Start Date End Date Taking? Authorizing Provider  rOPINIRole (REQUIP) 0.5 MG tablet Take 1 tablet (0.5 mg total) by mouth 2 (two) times daily. Patient taking differently: Take 3 mg by mouth daily.  05/20/18  Yes Horton, Mayer Maskerourtney F, MD  hydrOXYzine (ATARAX/VISTARIL) 25 MG tablet Take 1 tablet (25 mg total) by mouth every 6 (six) hours as needed for anxiety. Patient not taking: Reported on 08/14/2018 05/20/18   Shon BatonHorton, Courtney F, MD    Physical Exam: Vitals:   08/14/18 1932 08/14/18 2012 08/14/18 2013 08/14/18 2030  BP: (!) 133/95 129/83 129/83 127/84  Pulse: (!) 124 (!) 131 (!) 133 (!) 122  Resp: (!) 23 20  18   Temp:      TempSrc:  SpO2: 94% 97%  96%  Weight:      Height:        Constitutional: NAD, anxious, resting tremor  Eyes: PERTLA, lids and conjunctivae normal ENMT: Mucous membranes are moist. Posterior pharynx clear of any exudate or lesions.   Neck: normal, supple, no masses, no thyromegaly Respiratory: clear to auscultation bilaterally, no wheezing, no crackles. Normal respiratory effort.  Cardiovascular: Rate ~120 and regular. No extremity edema.   Abdomen: No distension, no tenderness, soft. Bowel sounds active.  Musculoskeletal: no clubbing / cyanosis. No joint deformity upper and lower  extremities.    Skin: no significant rashes, lesions, ulcers. Warm, dry, well-perfused. Neurologic: CN 2-12 grossly intact. Sensation intact. Strength 5/5 in all 4 limbs.  Psychiatric: Alert and oriented x 3. Anxious. Cooperative.    Labs on Admission: I have personally reviewed following labs and imaging studies  CBC: Recent Labs  Lab 08/14/18 1911  WBC 12.0*  NEUTROABS 10.2*  HGB 15.7  HCT 45.5  MCV 89.6  PLT 213   Basic Metabolic Panel: Recent Labs  Lab 08/14/18 1911  NA 133*  K 3.6  CL 98  CO2 21*  GLUCOSE 102*  BUN 13  CREATININE 0.63  CALCIUM 9.0  MG 1.8   GFR: Estimated Creatinine Clearance: 109.3 mL/min (by C-G formula based on SCr of 0.63 mg/dL). Liver Function Tests: Recent Labs  Lab 08/14/18 1911  AST 62*  ALT 44  ALKPHOS 84  BILITOT 1.3*  PROT 8.6*  ALBUMIN 4.2   No results for input(s): LIPASE, AMYLASE in the last 168 hours. No results for input(s): AMMONIA in the last 168 hours. Coagulation Profile: No results for input(s): INR, PROTIME in the last 168 hours. Cardiac Enzymes: No results for input(s): CKTOTAL, CKMB, CKMBINDEX, TROPONINI in the last 168 hours. BNP (last 3 results) No results for input(s): PROBNP in the last 8760 hours. HbA1C: No results for input(s): HGBA1C in the last 72 hours. CBG: No results for input(s): GLUCAP in the last 168 hours. Lipid Profile: No results for input(s): CHOL, HDL, LDLCALC, TRIG, CHOLHDL, LDLDIRECT in the last 72 hours. Thyroid Function Tests: No results for input(s): TSH, T4TOTAL, FREET4, T3FREE, THYROIDAB in the last 72 hours. Anemia Panel: No results for input(s): VITAMINB12, FOLATE, FERRITIN, TIBC, IRON, RETICCTPCT in the last 72 hours. Urine analysis:    Component Value Date/Time   COLORURINE AMBER (A) 08/14/2018 2003   APPEARANCEUR HAZY (A) 08/14/2018 2003   LABSPEC 1.026 08/14/2018 2003   PHURINE 6.0 08/14/2018 2003   GLUCOSEU NEGATIVE 08/14/2018 2003   HGBUR SMALL (A) 08/14/2018 2003    BILIRUBINUR NEGATIVE 08/14/2018 2003   KETONESUR 5 (A) 08/14/2018 2003   PROTEINUR 100 (A) 08/14/2018 2003   NITRITE NEGATIVE 08/14/2018 2003   LEUKOCYTESUR NEGATIVE 08/14/2018 2003   Sepsis Labs: @LABRCNTIP (procalcitonin:4,lacticidven:4) )No results found for this or any previous visit (from the past 240 hour(s)).   Radiological Exams on Admission: No results found.  EKG: Not performed.   Assessment/Plan   1. Alcohol withdrawal  - Reports managing his anxiety and insomnia with alcohol recently and now presents with worsening tremors and anxiety after not drinking today  - Improved in ED with IVF and Ativan, but remains tremulous and tachycardic  - Continue CIWA with as-needed Ativan, continue vitamin supplements and IVF hydration, consult with SW for resources to better manage his anxiety and insomnia     DVT prophylaxis: Lovenox  Code Status: Full  Family Communication: Discussed with patient  Consults called: None Admission status:  Observation     Briscoe Deutscher, MD [facility] Pager [phone]  If 7PM-7AM, please contact night-coverage [email or web address] Password [facility]  08/14/2018, 9:27 PM

## 2018-08-15 ENCOUNTER — Other Ambulatory Visit: Payer: Self-pay

## 2018-08-15 DIAGNOSIS — F419 Anxiety disorder, unspecified: Secondary | ICD-10-CM | POA: Diagnosis present

## 2018-08-15 DIAGNOSIS — Z8711 Personal history of peptic ulcer disease: Secondary | ICD-10-CM | POA: Diagnosis not present

## 2018-08-15 DIAGNOSIS — F10229 Alcohol dependence with intoxication, unspecified: Secondary | ICD-10-CM | POA: Diagnosis present

## 2018-08-15 DIAGNOSIS — F5104 Psychophysiologic insomnia: Secondary | ICD-10-CM | POA: Diagnosis present

## 2018-08-15 DIAGNOSIS — G2581 Restless legs syndrome: Secondary | ICD-10-CM | POA: Diagnosis present

## 2018-08-15 DIAGNOSIS — F329 Major depressive disorder, single episode, unspecified: Secondary | ICD-10-CM | POA: Diagnosis present

## 2018-08-15 DIAGNOSIS — F1223 Cannabis dependence with withdrawal: Secondary | ICD-10-CM | POA: Diagnosis present

## 2018-08-15 DIAGNOSIS — Z79899 Other long term (current) drug therapy: Secondary | ICD-10-CM | POA: Diagnosis not present

## 2018-08-15 DIAGNOSIS — E876 Hypokalemia: Secondary | ICD-10-CM | POA: Diagnosis not present

## 2018-08-15 DIAGNOSIS — F5101 Primary insomnia: Secondary | ICD-10-CM | POA: Diagnosis not present

## 2018-08-15 DIAGNOSIS — Z9114 Patient's other noncompliance with medication regimen: Secondary | ICD-10-CM | POA: Diagnosis not present

## 2018-08-15 DIAGNOSIS — F10231 Alcohol dependence with withdrawal delirium: Secondary | ICD-10-CM | POA: Diagnosis present

## 2018-08-15 DIAGNOSIS — Z818 Family history of other mental and behavioral disorders: Secondary | ICD-10-CM | POA: Diagnosis not present

## 2018-08-15 DIAGNOSIS — G40909 Epilepsy, unspecified, not intractable, without status epilepticus: Secondary | ICD-10-CM | POA: Diagnosis present

## 2018-08-15 DIAGNOSIS — F10230 Alcohol dependence with withdrawal, uncomplicated: Secondary | ICD-10-CM | POA: Diagnosis not present

## 2018-08-15 DIAGNOSIS — F10239 Alcohol dependence with withdrawal, unspecified: Secondary | ICD-10-CM | POA: Diagnosis present

## 2018-08-15 DIAGNOSIS — Z87891 Personal history of nicotine dependence: Secondary | ICD-10-CM | POA: Diagnosis not present

## 2018-08-15 DIAGNOSIS — T4276XA Underdosing of unspecified antiepileptic and sedative-hypnotic drugs, initial encounter: Secondary | ICD-10-CM | POA: Diagnosis present

## 2018-08-15 DIAGNOSIS — Y92009 Unspecified place in unspecified non-institutional (private) residence as the place of occurrence of the external cause: Secondary | ICD-10-CM | POA: Diagnosis not present

## 2018-08-15 DIAGNOSIS — F4310 Post-traumatic stress disorder, unspecified: Secondary | ICD-10-CM | POA: Diagnosis present

## 2018-08-15 DIAGNOSIS — F1910 Other psychoactive substance abuse, uncomplicated: Secondary | ICD-10-CM | POA: Diagnosis not present

## 2018-08-15 DIAGNOSIS — Z91128 Patient's intentional underdosing of medication regimen for other reason: Secondary | ICD-10-CM | POA: Diagnosis not present

## 2018-08-15 LAB — COMPREHENSIVE METABOLIC PANEL
ALK PHOS: 82 U/L (ref 38–126)
ALT: 34 U/L (ref 0–44)
ANION GAP: 8 (ref 5–15)
AST: 53 U/L — ABNORMAL HIGH (ref 15–41)
Albumin: 3.3 g/dL — ABNORMAL LOW (ref 3.5–5.0)
BUN: 18 mg/dL (ref 6–20)
CO2: 22 mmol/L (ref 22–32)
Calcium: 8.6 mg/dL — ABNORMAL LOW (ref 8.9–10.3)
Chloride: 107 mmol/L (ref 98–111)
Creatinine, Ser: 0.7 mg/dL (ref 0.61–1.24)
GFR calc Af Amer: >60 mL/min (ref >60)
GFR calc non Af Amer: >60 mL/min (ref >60)
Glucose, Bld: 146 mg/dL — ABNORMAL HIGH (ref 70–99)
Potassium: 2.8 mmol/L — ABNORMAL LOW (ref 3.5–5.1)
Sodium: 137 mmol/L (ref 135–145)
Total Bilirubin: 1 mg/dL (ref 0.3–1.2)
Total Protein: 6.7 g/dL (ref 6.5–8.1)

## 2018-08-15 LAB — CBC WITH DIFFERENTIAL/PLATELET
Abs Immature Granulocytes: 0.02 10*3/uL (ref 0.00–0.07)
BASOS PCT: 0 %
Basophils Absolute: 0 10*3/uL (ref 0.0–0.1)
Eosinophils Absolute: 0 10*3/uL (ref 0.0–0.5)
Eosinophils Relative: 0 %
HCT: 40.8 % (ref 39.0–52.0)
Hemoglobin: 13.3 g/dL (ref 13.0–17.0)
Immature Granulocytes: 0 %
Lymphocytes Relative: 13 %
Lymphs Abs: 1 10*3/uL (ref 0.7–4.0)
MCH: 30.6 pg (ref 26.0–34.0)
MCHC: 32.6 g/dL (ref 30.0–36.0)
MCV: 94 fL (ref 80.0–100.0)
Monocytes Absolute: 0.5 10*3/uL (ref 0.1–1.0)
Monocytes Relative: 7 %
NRBC: 0 % (ref 0.0–0.2)
Neutro Abs: 6.1 10*3/uL (ref 1.7–7.7)
Neutrophils Relative %: 80 %
PLATELETS: 180 10*3/uL (ref 150–400)
RBC: 4.34 MIL/uL (ref 4.22–5.81)
RDW: 11.4 % — ABNORMAL LOW (ref 11.5–15.5)
WBC: 7.7 10*3/uL (ref 4.0–10.5)

## 2018-08-15 MED ORDER — RISPERIDONE 0.5 MG PO TABS
0.2500 mg | ORAL_TABLET | Freq: Once | ORAL | Status: AC
  Administered 2018-08-16: 0.25 mg via ORAL
  Filled 2018-08-15: qty 1

## 2018-08-15 MED ORDER — POTASSIUM CHLORIDE CRYS ER 20 MEQ PO TBCR
40.0000 meq | EXTENDED_RELEASE_TABLET | Freq: Once | ORAL | Status: AC
  Administered 2018-08-15: 40 meq via ORAL
  Filled 2018-08-15: qty 2

## 2018-08-15 MED ORDER — POTASSIUM CHLORIDE 10 MEQ/100ML IV SOLN
10.0000 meq | INTRAVENOUS | Status: AC
  Administered 2018-08-15 (×6): 10 meq via INTRAVENOUS
  Filled 2018-08-15: qty 100

## 2018-08-15 MED ORDER — NICOTINE 21 MG/24HR TD PT24
21.0000 mg | MEDICATED_PATCH | Freq: Every day | TRANSDERMAL | Status: DC
  Administered 2018-08-15 – 2018-08-16 (×3): 21 mg via TRANSDERMAL
  Filled 2018-08-15 (×3): qty 1

## 2018-08-15 MED ORDER — ZOLPIDEM TARTRATE 5 MG PO TABS
5.0000 mg | ORAL_TABLET | Freq: Every evening | ORAL | Status: DC | PRN
  Administered 2018-08-15 (×2): 5 mg via ORAL
  Filled 2018-08-15 (×2): qty 1

## 2018-08-15 MED ORDER — MAGNESIUM SULFATE 2 GM/50ML IV SOLN
2.0000 g | Freq: Once | INTRAVENOUS | Status: AC
  Administered 2018-08-15: 2 g via INTRAVENOUS
  Filled 2018-08-15: qty 50
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
## 2018-08-15 NOTE — Progress Notes (Signed)
Patient was sleeping and would not wake up for IS. I attempted and he continued to roll around in the bed. IS is at bedside.

## 2018-08-15 NOTE — Progress Notes (Signed)
PROGRESS NOTE    Andrew Parks  [id number] DOB: 04/26/1971 DOA: 08/14/2018 PCP: Patient, No Pcp Per   Brief Narrative: Patient is a 48 year old male with past medical history of PTSD, anxiety, depression, severe insomnia who presented to the emergency room for the evaluation of insomnia, tremors and general malaise.  Patient reports that he was using marijuana and alcohol to help with the sleep.  He has been drinking   eighteen 12 ounces beer every day.  Patient admitted for management of alcohol withdrawal  Assessment & Plan:   Principal Problem:   Alcohol withdrawal (HCC)  Chronic alcohol abuse/alcohol withdrawal: Reported with worsening tremors, anxiety after not drinking. Has been drinking alcohol to help with his sleep.  Also smokes marijuana. Started on CIWA protocol.  Looked tremulous and drowsy this morning. Continue CIWA protocol. Thiamine and folic acid. Social worker will be consulted to help with rehabilitation  History of PTSD/anxiety/depression: Not seeing any psychiatrist or primary care at present.  I have requested for psychiatric consult today.  Severe hypokalemia:  Supplemented with potassium and magnesium.         DVT prophylaxis: Lovenox Code Status: Full Family Communication: None present at the bedside Disposition Plan: Home after resolution of alcohol withdrawal, psychiatric evaluation   Consultants: Psychiatry  Procedures: None  Antimicrobials:  Anti-infectives (From admission, onward)   None      Subjective: Patient seen and examined at bedside this morning.  He looked drowsy and had to be woke up by shaking his body.  After waking up, he looked alert and oriented.  Objective: Vitals:   08/14/18 2241 08/15/18 0034 08/15/18 0036 08/15/18 0635  BP: 119/84 (!) 129/93 (!) 144/86 (!) 142/91  Pulse:  (!) 102 (!) 103 90  Resp: 13 (!) 22  18  Temp:  98.2 F (36.8 C)  98.1 F (36.7 C)  TempSrc:  Oral  Oral  SpO2:  97% 97% 99%  Weight:        Height:        Intake/Output Summary (Last 24 hours) at 08/15/2018 [id number] Last data filed at 08/15/2018 0500 Gross per 24 hour  Intake 480 ml  Output -  Net 480 ml   Filed Weights   08/14/18 1841  Weight: 77.6 kg    Examination:  General exam: Not in distress,average built, sleepy, disheavled HEENT:PERRL,Oral mucosa moist, Ear/Nose normal on gross exam Respiratory system: Bilateral equal air entry, normal vesicular breath sounds, no wheezes or crackles  Cardiovascular system: S1 & S2 heard, RRR. No JVD, murmurs, rubs, gallops or clicks. No pedal edema. Gastrointestinal system: Abdomen is nondistended, soft and nontender. No organomegaly or masses felt. Normal bowel sounds heard. Central nervous system: Alert and oriented. No focal neurological deficits. Extremities: No edema, no clubbing ,no cyanosis, distal peripheral pulses palpable. Skin: Tattoos all over his body ,no rashes, lesions or ulcers,no icterus ,no pallor MSK: Normal muscle bulk,tone ,power   Data Reviewed: I have personally reviewed following labs and imaging studies  CBC: Recent Labs  Lab 08/14/18 1911 08/15/18 0439  WBC 12.0* 7.7  NEUTROABS 10.2* 6.1  HGB 15.7 13.3  HCT 45.5 40.8  MCV 89.6 94.0  PLT 213 180   Basic Metabolic Panel: Recent Labs  Lab 08/14/18 1911 08/15/18 0439  NA 133* 137  K 3.6 2.8*  CL 98 107  CO2 21* 22  GLUCOSE 102* 146*  BUN 13 18  CREATININE 0.63 0.70  CALCIUM 9.0 8.6*  MG 1.8  --    GFR: Estimated  Creatinine Clearance: 109.3 mL/min (by C-G formula based on SCr of 0.7 mg/dL). Liver Function Tests: Recent Labs  Lab 08/14/18 1911 08/15/18 0439  AST 62* 53*  ALT 44 34  ALKPHOS 84 82  BILITOT 1.3* 1.0  PROT 8.6* 6.7  ALBUMIN 4.2 3.3*   No results for input(s): LIPASE, AMYLASE in the last 168 hours. No results for input(s): AMMONIA in the last 168 hours. Coagulation Profile: No results for input(s): INR, PROTIME in the last 168 hours. Cardiac Enzymes: No  results for input(s): CKTOTAL, CKMB, CKMBINDEX, TROPONINI in the last 168 hours. BNP (last 3 results) No results for input(s): PROBNP in the last 8760 hours. HbA1C: No results for input(s): HGBA1C in the last 72 hours. CBG: No results for input(s): GLUCAP in the last 168 hours. Lipid Profile: No results for input(s): CHOL, HDL, LDLCALC, TRIG, CHOLHDL, LDLDIRECT in the last 72 hours. Thyroid Function Tests: No results for input(s): TSH, T4TOTAL, FREET4, T3FREE, THYROIDAB in the last 72 hours. Anemia Panel: No results for input(s): VITAMINB12, FOLATE, FERRITIN, TIBC, IRON, RETICCTPCT in the last 72 hours. Sepsis Labs: No results for input(s): PROCALCITON, LATICACIDVEN in the last 168 hours.  No results found for this or any previous visit (from the past 240 hour(s)).       Radiology Studies: No results found.      Scheduled Meds: . enoxaparin (LOVENOX) injection  40 mg Subcutaneous Q24H  . folic acid  1 mg Oral Daily  . LORazepam  0-4 mg Oral Q6H   Followed by  . [START ON 08/16/2018] LORazepam  0-4 mg Oral Q12H  . multivitamin with minerals  1 tablet Oral Daily  . nicotine  21 mg Transdermal Daily  . potassium chloride  40 mEq Oral Once  . rOPINIRole  0.5 mg Oral BID  . sodium chloride flush  3 mL Intravenous Q12H  . thiamine  100 mg Oral Daily   Or  . thiamine  100 mg Intravenous Daily   Continuous Infusions: . magnesium sulfate 1 - 4 g bolus IVPB    . potassium chloride       LOS: 0 days    Time spent: More than 50% of that time was spent in counseling and/or coordination of care.      Burnadette Pop, MD [facility] Pager [phone]  If 7PM-7AM, please contact night-coverage [email or web address] Password [facility] 08/15/2018, 9:25 AM

## 2018-08-15 NOTE — Plan of Care (Signed)

## 2018-08-15 NOTE — Care Management Obs Status (Signed)
MEDICARE OBSERVATION STATUS NOTIFICATION   Patient Details  Name: Andrew Parks MRN: [id number] Date of Birth: 07/01/1971   Medicare Observation Status Notification Given:  Yes    Renie Ora 08/15/2018, 4:05 PM

## 2018-08-16 DIAGNOSIS — G2581 Restless legs syndrome: Secondary | ICD-10-CM | POA: Diagnosis present

## 2018-08-16 DIAGNOSIS — F4310 Post-traumatic stress disorder, unspecified: Secondary | ICD-10-CM | POA: Diagnosis present

## 2018-08-16 DIAGNOSIS — F1910 Other psychoactive substance abuse, uncomplicated: Secondary | ICD-10-CM | POA: Diagnosis present

## 2018-08-16 DIAGNOSIS — G4700 Insomnia, unspecified: Secondary | ICD-10-CM | POA: Diagnosis present

## 2018-08-16 LAB — BASIC METABOLIC PANEL
Anion gap: 9 (ref 5–15)
BUN: 11 mg/dL (ref 6–20)
CO2: 19 mmol/L — ABNORMAL LOW (ref 22–32)
Calcium: 8.1 mg/dL — ABNORMAL LOW (ref 8.9–10.3)
Chloride: 106 mmol/L (ref 98–111)
Creatinine, Ser: 0.62 mg/dL (ref 0.61–1.24)
GFR calc Af Amer: >60 mL/min (ref >60)
GFR calc non Af Amer: >60 mL/min (ref >60)
Glucose, Bld: 184 mg/dL — ABNORMAL HIGH (ref 70–99)
POTASSIUM: 2.9 mmol/L — AB (ref 3.5–5.1)
Sodium: 134 mmol/L — ABNORMAL LOW (ref 135–145)

## 2018-08-16 LAB — MAGNESIUM: Magnesium: 1.8 mg/dL (ref 1.7–2.4)

## 2018-08-16 LAB — HEMOGLOBIN A1C
Hgb A1c MFr Bld: 5.7 % — ABNORMAL HIGH (ref 4.8–5.6)
Mean Plasma Glucose: 116.89 mg/dL

## 2018-08-16 MED ORDER — NICOTINE 21 MG/24HR TD PT24
21.0000 mg | MEDICATED_PATCH | Freq: Every day | TRANSDERMAL | Status: DC
  Administered 2018-08-16 – 2018-08-17 (×2): 21 mg via TRANSDERMAL
  Filled 2018-08-16 (×2): qty 1

## 2018-08-16 MED ORDER — ZOLPIDEM TARTRATE 5 MG PO TABS
10.0000 mg | ORAL_TABLET | Freq: Every evening | ORAL | Status: DC | PRN
  Administered 2018-08-17: 10 mg via ORAL
  Filled 2018-08-16: qty 2

## 2018-08-16 MED ORDER — QUETIAPINE FUMARATE 25 MG PO TABS
25.0000 mg | ORAL_TABLET | Freq: Every evening | ORAL | Status: DC | PRN
  Administered 2018-08-16: 25 mg via ORAL
  Filled 2018-08-16: qty 1

## 2018-08-16 MED ORDER — SODIUM CHLORIDE 0.9 % IV SOLN
INTRAVENOUS | Status: DC
  Administered 2018-08-16 – 2018-08-17 (×2): via INTRAVENOUS

## 2018-08-16 MED ORDER — POTASSIUM CHLORIDE 10 MEQ/100ML IV SOLN
10.0000 meq | INTRAVENOUS | Status: AC
  Administered 2018-08-16 (×5): 10 meq via INTRAVENOUS
  Filled 2018-08-16 (×5): qty 100
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
## 2018-08-16 NOTE — Progress Notes (Signed)
Patient expressing fears to RN regarding being discharged. States that if he doesn't get help for his insomnia, he will turn to "whatever it takes to sleep, and next time it will be worse". Patient tearfully expressing his frustration and anxiety and states that he will use alcohol, heroine, or whatever he feels will help him to sleep. Patient is adamant that he is not suicidal, but desperate to find relief. Requested that Dr. Laural Benes be notified and a page was sent. Will continue to monitor.

## 2018-08-16 NOTE — Progress Notes (Signed)
PROGRESS NOTE  Andrew Parks  [id number]  DOB: 04/07/1971  DOA: 08/14/2018 PCP: Patient, No Pcp Per   Brief Admission Hx: 48 y/o male recreational drug and alcohol abuser presented with hypomania, acute alcohol withdrawal, marijuana withdrawal, he had been binge drinking recently when he was unable to obtain marijuana.   MDM/Assessment & Plan:   1. Polysubstance abuse -this 48 year old [place] from [place] apparently has been treating his psychiatric problems with marijuana and alcohol.  When he was unable to obtain marijuana he began binge drinking alcohol.  I consulted psychiatry/TTS and they evaluated him and felt that he did not require inpatient treatment at this time.  They did give him some resources for local treatment. 2. Alcohol withdrawal-patient has been recently binge drinking.  Continue CIWA protocol. 3. Probable bipolar disorder- patient has an appointment with [facility] on Monday which he has been strongly advised to keep.  The psychiatry PA with TTS did not recommend starting medication at this time as [facility] and they likely will start medications and treatments. 4. Severe hypokalemia - he seems to be total body depleted, additional runs of K ordered, magnesium being replaced, recheck in AM.  5. H/o PTSD/Anx/Depression - [facility]  He was seen by TTS and inpatient treatment was not recommended at this time.  6. Chronic insomnia- patient has been self-medicating with marijuana and alcohol.  He has zolpidem ordered as needed for symptoms. 7. RLS- he had been started on Requip.  DVT prophylaxis: lovenox Code Status: Full  Family Communication: patient Disposition Plan: possible DC 1/26 if electrolytes can be corrected   Consultants:  TTS / psychiatry behavioral health   Procedures:  n/a  Antimicrobials:  n/a   Subjective: Pt remains agitated with pressured speech, multiple complaints including  RLS, insomnia, wants marijuana  Objective: Vitals:   08/15/18 2332 08/15/18 2343 08/16/18 0535 08/16/18 1225  BP: (!) 145/100 (!) 149/96 126/88 124/90  Pulse: (!) 105  83 89  Resp: 18  (!) 21   Temp: 97.7 F (36.5 C)  (!) 97.5 F (36.4 C)   TempSrc: Oral  Oral   SpO2: 98%  98% 98%  Weight:      Height:        Intake/Output Summary (Last 24 hours) at 08/16/2018 1404 Last data filed at 08/15/2018 1809 Gross per 24 hour  Intake 240 ml  Output -  Net 240 ml   Filed Weights   08/14/18 1841  Weight: 77.6 kg   REVIEW OF SYSTEMS  As per history otherwise all reviewed and reported negative  Exam:  General exam: awake, alert, eating, agitated, angry, confrontational. Respiratory system: Clear. No increased work of breathing. Cardiovascular system: S1 & S2 heard. No JVD, murmurs, gallops, clicks or pedal edema. Gastrointestinal system: Abdomen is nondistended, soft and nontender. Normal bowel sounds heard. Central nervous system: Alert and oriented. No focal neurological deficits. Extremities: no CCE. Psych: pt seems hypomanic with pressured speech  Data Reviewed: Basic Metabolic Panel: Recent Labs  Lab 08/14/18 1911 08/15/18 0439 08/16/18 0610  NA 133* 137 134*  K 3.6 2.8* 2.9*  CL 98 107 106  CO2 21* 22 19*  GLUCOSE 102* 146* 184*  BUN 13 18 11   CREATININE 0.63 0.70 0.62  CALCIUM 9.0 8.6* 8.1*  MG 1.8  --  1.8   Liver Function Tests: Recent Labs  Lab 08/14/18 1911 08/15/18 0439  AST 62* 53*  ALT 44 34  ALKPHOS 84 82  BILITOT 1.3* 1.0  PROT 8.6* 6.7  ALBUMIN 4.2 3.3*   No results for input(s): LIPASE, AMYLASE in the last 168 hours. No results for input(s): AMMONIA in the last 168 hours. CBC: Recent Labs  Lab 08/14/18 1911 08/15/18 0439  WBC 12.0* 7.7  NEUTROABS 10.2* 6.1  HGB 15.7 13.3  HCT 45.5 40.8  MCV 89.6 94.0  PLT 213 180   Cardiac Enzymes: No results for input(s): CKTOTAL, CKMB, CKMBINDEX, TROPONINI in the last 168 hours. CBG (last 3)    No results for input(s): GLUCAP in the last 72 hours. No results found for this or any previous visit (from the past 240 hour(s)).   Studies: No results found.  Scheduled Meds: . enoxaparin (LOVENOX) injection  40 mg Subcutaneous Q24H  . folic acid  1 mg Oral Daily  . LORazepam  0-4 mg Oral Q6H   Followed by  . LORazepam  0-4 mg Oral Q12H  . multivitamin with minerals  1 tablet Oral Daily  . nicotine  21 mg Transdermal Daily  . rOPINIRole  0.5 mg Oral BID  . sodium chloride flush  3 mL Intravenous Q12H  . thiamine  100 mg Oral Daily   Or  . thiamine  100 mg Intravenous Daily   Continuous Infusions: . sodium chloride    . potassium chloride      Principal Problem:   Alcohol withdrawal (HCC) Active Problems:   Polysubstance abuse (HCC)   RLS (restless legs syndrome)   PTSD (post-traumatic stress disorder)   Insomnia  Time spent:   Standley Dakinslanford Karry Barrilleaux, MD [facility] 08/16/2018, 2:04 PM    LOS: 1 day

## 2018-08-16 NOTE — Progress Notes (Signed)
Patient unable to sleep more than an hour. Patient comes out of room and walks halls. Patient asking for medication to help with sleep. Patient has no medication scheduled at this time to help him rest. Patient has been given everything available at this time. Advise patient when next scheduled mediation is due.  Patient verbalized understanding. Will continue to monitor throughout shift.

## 2018-08-16 NOTE — Progress Notes (Signed)
Attempted to tele-psych patient today after TTS had assessed patient to decide if patient needed to be inpatient or could be cleared from psychiatry.  Nursing staff contacted me back and stated that patient refused to speak to anyone else at this time and that may be around 12:00 he would speak to [place] again.  He states that the assessment got him a little upset and needed some time before speaking to someone else.

## 2018-08-16 NOTE — Progress Notes (Signed)
Attempted several times to administer potassium runs to patient, but patient taking extended shower. Pt's girlfriend in room and states he won't come out until he is done. Will continue to attempt to administer.

## 2018-08-17 DIAGNOSIS — F4310 Post-traumatic stress disorder, unspecified: Secondary | ICD-10-CM

## 2018-08-17 DIAGNOSIS — F5101 Primary insomnia: Secondary | ICD-10-CM

## 2018-08-17 DIAGNOSIS — F10230 Alcohol dependence with withdrawal, uncomplicated: Secondary | ICD-10-CM

## 2018-08-17 DIAGNOSIS — F1910 Other psychoactive substance abuse, uncomplicated: Secondary | ICD-10-CM

## 2018-08-17 DIAGNOSIS — G2581 Restless legs syndrome: Secondary | ICD-10-CM

## 2018-08-17 LAB — BASIC METABOLIC PANEL
ANION GAP: 9 (ref 5–15)
BUN: 14 mg/dL (ref 6–20)
CO2: 20 mmol/L — ABNORMAL LOW (ref 22–32)
Calcium: 8.9 mg/dL (ref 8.9–10.3)
Chloride: 106 mmol/L (ref 98–111)
Creatinine, Ser: 0.56 mg/dL — ABNORMAL LOW (ref 0.61–1.24)
GFR calc Af Amer: >60 mL/min (ref >60)
Glucose, Bld: 96 mg/dL (ref 70–99)
Potassium: 4 mmol/L (ref 3.5–5.1)
Sodium: 135 mmol/L (ref 135–145)

## 2018-08-17 LAB — MAGNESIUM: Magnesium: 2 mg/dL (ref 1.7–2.4)

## 2018-08-17 MED ORDER — NICOTINE POLACRILEX 2 MG MT GUM: 2.0000 mg | CHEWING_GUM | OROMUCOSAL | 0 refills | Status: DC | PRN

## 2018-08-17 MED ORDER — LORAZEPAM 1 MG PO TABS: 1.0000 mg | ORAL_TABLET | Freq: Two times a day (BID) | ORAL | 0 refills | Status: DC | PRN

## 2018-08-17 MED ORDER — LORAZEPAM 1 MG PO TABS
1.0000 mg | ORAL_TABLET | ORAL | Status: AC
  Administered 2018-08-17: 1 mg via ORAL
  Filled 2018-08-17: qty 1

## 2018-08-17 NOTE — Discharge Summary (Signed)
Physician Discharge Summary  Andrew Parks [id number]:[id number] DOB: 07-18-1971 DOA: 08/14/2018  PCP: Patient, No Pcp Per  Admit date: 08/14/2018 Discharge date: 08/17/2018  Admitted From: Home Disposition: Home  Recommendations for Outpatient Follow-up:  1. Follow up with [facility] scheduled for tomorrow  Discharge Condition: Stable CODE STATUS: Full code Diet recommendation: Regular diet  Brief/Interim Summary: 48 year old male with a history of recreational drug use, chronic insomnia, reports that he recently started binge drinking to help treat his insomnia.  He normally uses marijuana to help him sleep, but was unable to get any for several days.  He began to binge drink to help him sleep.  He was admitted to the hospital where electrolytes were replaced.  He was placed on CIWA protocol and at this time does not have any evidence of significant withdrawal.  He was seen by behavioral health and it was not felt that patient met criteria for inpatient treatment at this time.  The patient has a follow-up scheduled [facility] for tomorrow.  It was recommended that he keep this appointment.  The patient is not suicidal at this time.  He will be given 3 tablets of Ativan to take until he can make his appointment tomorrow and discuss his mental health issues with his psychiatrist.  I suspect that his insomnia is largely driven by his anxiety.  Discharge Diagnoses:  Principal Problem:   Alcohol withdrawal ([place]) Active Problems:   Polysubstance abuse (HCC)   RLS (restless legs syndrome)   PTSD (post-traumatic stress disorder)   Insomnia    Discharge Instructions  Discharge Instructions    Diet - low sodium heart healthy   Complete by:  As directed    Increase activity slowly   Complete by:  As directed      Allergies as of 08/17/2018   No Known Allergies     Medication List    STOP taking these medications   hydrOXYzine 25 MG tablet Commonly known as:  ATARAX/VISTARIL     TAKE these  medications   LORazepam 1 MG tablet Commonly known as:  ATIVAN Take 1 tablet (1 mg total) by mouth 2 (two) times daily as needed for anxiety.   nicotine polacrilex 2 MG gum Commonly known as:  NICORETTE Take 1 each (2 mg total) by mouth as needed for smoking cessation.   rOPINIRole 0.5 MG tablet Commonly known as:  REQUIP Take 1 tablet (0.5 mg total) by mouth 2 (two) times daily. What changed:    how much to take  when to take this      Follow-up Information    [facility] Follow up on 08/18/2018.   Why:  Monday at 9:30 for your hospital follow up appointment.  Bring your ID, your SS card and your nsurance card Contact information: [address] [place] [address] [phone]          No Known Allergies  Consultations:  Behavioral health   Procedures/Studies: No results found.    Subjective: Patient is feeling anxious right now.  He denies any suicidal ideations.  Regarding the comments he made overnight about wanting to leave AMA and just "ending it", patient clarified what he meant by this, saying that he would leave AMA and go and get some drugs.  He denied any suicidal ideations.  Discharge Exam: Vitals:   08/16/18 2125 08/17/18 0605 08/17/18 0638 08/17/18 1200  BP: (!) 146/102 (!) 131/99 121/80   Pulse: (!) 104 98 94 100  Resp: 20  Temp: 98.2 F (36.8 C) 98.2 F (36.8 C) 98.3 F (36.8 C)   TempSrc: Oral Oral Oral   SpO2: 96%     Weight:      Height:        General: Pt is alert, awake, not in acute distress Cardiovascular: RRR, S1/S2 +, no rubs, no gallops Respiratory: CTA bilaterally, no wheezing, no rhonchi Abdominal: Soft, NT, ND, bowel sounds + Extremities: no edema, no cyanosis    The results of significant diagnostics from this hospitalization (including imaging, microbiology, ancillary and laboratory) are listed below for reference.     Microbiology: No results found for this or any previous visit (from the past 240  hour(s)).   Labs: BNP (last 3 results) No results for input(s): BNP in the last 8760 hours. Basic Metabolic Panel: Recent Labs  Lab 08/14/18 1911 08/15/18 0439 08/16/18 0610 08/17/18 0712  NA 133* 137 134* 135  K 3.6 2.8* 2.9* 4.0  CL 98 107 106 106  CO2 21* 22 19* 20*  GLUCOSE 102* 146* 184* 96  BUN _0 CREATININE 0.63 0.70 0.62 0.56*  CALCIUM 9.0 8.6* 8.1* 8.9  MG 1.8  --  1.8 2.0   Liver Function Tests: Recent Labs  Lab 08/14/18 1911 08/15/18 0439  AST 62* 53*  ALT 44 34  ALKPHOS 84 82  BILITOT 1.3* 1.0  PROT 8.6* 6.7  ALBUMIN 4.2 3.3*   No results for input(s): LIPASE, AMYLASE in the last 168 hours. No results for input(s): AMMONIA in the last 168 hours. CBC: Recent Labs  Lab 08/14/18 1911 08/15/18 0439  WBC 12.0* 7.7  NEUTROABS 10.2* 6.1  HGB 15.7 13.3  HCT 45.5 40.8  MCV 89.6 94.0  PLT 213 180   Cardiac Enzymes: No results for input(s): CKTOTAL, CKMB, CKMBINDEX, TROPONINI in the last 168 hours. BNP: Invalid input(s): POCBNP CBG: No results for input(s): GLUCAP in the last 168 hours. D-Dimer No results for input(s): DDIMER in the last 72 hours. Hgb A1c Recent Labs    08/16/18 0610  HGBA1C 5.7*   Lipid Profile No results for input(s): CHOL, HDL, LDLCALC, TRIG, CHOLHDL, LDLDIRECT in the last 72 hours. Thyroid function studies No results for input(s): TSH, T4TOTAL, T3FREE, THYROIDAB in the last 72 hours.  Invalid input(s): FREET3 Anemia work up No results for input(s): VITAMINB12, FOLATE, FERRITIN, TIBC, IRON, RETICCTPCT in the last 72 hours. Urinalysis    Component Value Date/Time   COLORURINE AMBER (A) 08/14/2018 2003   APPEARANCEUR HAZY (A) 08/14/2018 2003   LABSPEC 1.026 08/14/2018 2003   PHURINE 6.0 08/14/2018 2003   GLUCOSEU NEGATIVE 08/14/2018 2003   HGBUR SMALL (A) 08/14/2018 2003   BILIRUBINUR NEGATIVE 08/14/2018 2003   KETONESUR 5 (A) 08/14/2018 2003   PROTEINUR 100 (A) 08/14/2018 2003   NITRITE NEGATIVE 08/14/2018  2003   LEUKOCYTESUR NEGATIVE 08/14/2018 2003   Sepsis Labs Invalid input(s): PROCALCITONIN,  WBC,  LACTICIDVEN Microbiology No results found for this or any previous visit (from the past 240 hour(s)).   Time coordinating discharge: 46mns  SIGNED:   JKathie Dike MD  [facility] 08/17/2018, 8:39 PM   If 7PM-7AM, please contact night-coverage [email or web address]

## 2018-08-17 NOTE — Progress Notes (Signed)
Patient verbalized wanting to leave AMA  Stated he is tired of his issues. Stated he wants to go into the street and end it. Staff spoke with patient about how he is feeling. Patient stated he is not able to sleep he keeps thinking of issues that has happened in the past and stated he just wants to end it. After talking with staff patient became more calm patient asked if the could have anything to help him.patient walked back to room.  Scored patient per CIWA protocol and gave Prn IV ativan see MAR for correct dosage. MD made aware of situation. Patient at this time is resting in bed with eyes closed. Breathing is even and nonlabored. Will continue to monitor throughout shift.

## 2018-08-17 NOTE — Progress Notes (Deleted)
Physician Discharge Summary  Grayton Lobo CZY:[id number] DOB: Sep 23, 1970 DOA: 08/14/2018  PCP: Patient, No Pcp Per  Admit date: 08/14/2018 Discharge date: 08/17/2018  Admitted From: Home Disposition: Home  Recommendations for Outpatient Follow-up:  1. Follow up with [facility] scheduled for tomorrow  Discharge Condition: Stable CODE STATUS: Full code Diet recommendation: Regular diet  Brief/Interim Summary: 48 year old male with a history of recreational drug use, chronic insomnia, reports that he recently started binge drinking to help treat his insomnia.  He normally uses marijuana to help him sleep, but was unable to get any for several days.  He began to binge drink to help him sleep.  He was admitted to the hospital where electrolytes were replaced.  He was placed on CIWA protocol and at this time does not have any evidence of significant withdrawal.  He was seen by behavioral health and it was not felt that patient met criteria for inpatient treatment at this time.  The patient has a follow-up scheduled [facility] for tomorrow.  It was recommended that he keep this appointment.  The patient is not suicidal at this time.  He will be given 3 tablets of Ativan to take until he can make his appointment tomorrow and discuss his mental health issues with his psychiatrist.  I suspect that his insomnia is largely driven by his anxiety.  Discharge Diagnoses:  Principal Problem:   Alcohol withdrawal ([place]) Active Problems:   Polysubstance abuse (HCC)   RLS (restless legs syndrome)   PTSD (post-traumatic stress disorder)   Insomnia    Discharge Instructions  Discharge Instructions    Diet - low sodium heart healthy   Complete by:  As directed    Increase activity slowly   Complete by:  As directed      Allergies as of 08/17/2018   No Known Allergies     Medication List    STOP taking these medications   hydrOXYzine 25 MG tablet Commonly known as:  ATARAX/VISTARIL     TAKE these  medications   LORazepam 1 MG tablet Commonly known as:  ATIVAN Take 1 tablet (1 mg total) by mouth 2 (two) times daily as needed for anxiety.   nicotine polacrilex 2 MG gum Commonly known as:  NICORETTE Take 1 each (2 mg total) by mouth as needed for smoking cessation.   rOPINIRole 0.5 MG tablet Commonly known as:  REQUIP Take 1 tablet (0.5 mg total) by mouth 2 (two) times daily. What changed:    how much to take  when to take this      Follow-up Information    Services, [facility] Follow up on 08/18/2018.   Why:  Monday at 9:30 for your hospital follow up appointment.  Bring your ID, your SS card and your nsurance card Contact information: [place] [address] [place] [address] [phone]          No Known Allergies  Consultations:  Behavioral health   Procedures/Studies:  No results found.    Subjective: Patient is feeling anxious right now.  He denies any suicidal ideations.  Regarding the comments he made overnight about wanting to leave AMA and just "ending it", patient clarified what he meant by this, saying that he would leave AMA and go and get some drugs.  He denied any suicidal ideations.  Discharge Exam: Vitals:   08/16/18 2125 08/17/18 0605 08/17/18 0638 08/17/18 1200  BP: (!) 146/102 (!) 131/99 121/80   Pulse: (!) 104 98 94 100  Resp: 20  Temp: 98.2 F (36.8 C) 98.2 F (36.8 C) 98.3 F (36.8 C)   TempSrc: Oral Oral Oral   SpO2: 96%     Weight:      Height:        General: Pt is alert, awake, not in acute distress Cardiovascular: RRR, S1/S2 +, no rubs, no gallops Respiratory: CTA bilaterally, no wheezing, no rhonchi Abdominal: Soft, NT, ND, bowel sounds + Extremities: no edema, no cyanosis    The results of significant diagnostics from this hospitalization (including imaging, microbiology, ancillary and laboratory) are listed below for reference.     Microbiology: No results found for this or any previous visit (from the past  240 hour(s)).   Labs: BNP (last 3 results) No results for input(s): BNP in the last 8760 hours. Basic Metabolic Panel: Recent Labs  Lab 08/14/18 1911 08/15/18 0439 08/16/18 0610 08/17/18 0712  NA 133* 137 134* 135  K 3.6 2.8* 2.9* 4.0  CL 98 107 106 106  CO2 21* 22 19* 20*  GLUCOSE 102* 146* 184* 96  BUN 13 18 11 14   CREATININE 0.63 0.70 0.62 0.56*  CALCIUM 9.0 8.6* 8.1* 8.9  MG 1.8  --  1.8 2.0   Liver Function Tests: Recent Labs  Lab 08/14/18 1911 08/15/18 0439  AST 62* 53*  ALT 44 34  ALKPHOS 84 82  BILITOT 1.3* 1.0  PROT 8.6* 6.7  ALBUMIN 4.2 3.3*   No results for input(s): LIPASE, AMYLASE in the last 168 hours. No results for input(s): AMMONIA in the last 168 hours. CBC: Recent Labs  Lab 08/14/18 1911 08/15/18 0439  WBC 12.0* 7.7  NEUTROABS 10.2* 6.1  HGB 15.7 13.3  HCT 45.5 40.8  MCV 89.6 94.0  PLT 213 180   Cardiac Enzymes: No results for input(s): CKTOTAL, CKMB, CKMBINDEX, TROPONINI in the last 168 hours. BNP: Invalid input(s): POCBNP CBG: No results for input(s): GLUCAP in the last 168 hours. D-Dimer No results for input(s): DDIMER in the last 72 hours. Hgb A1c Recent Labs    08/16/18 0610  HGBA1C 5.7*   Lipid Profile No results for input(s): CHOL, HDL, LDLCALC, TRIG, CHOLHDL, LDLDIRECT in the last 72 hours. Thyroid function studies No results for input(s): TSH, T4TOTAL, T3FREE, THYROIDAB in the last 72 hours.  Invalid input(s): FREET3 Anemia work up No results for input(s): VITAMINB12, FOLATE, FERRITIN, TIBC, IRON, RETICCTPCT in the last 72 hours. Urinalysis    Component Value Date/Time   COLORURINE AMBER (A) 08/14/2018 2003   APPEARANCEUR HAZY (A) 08/14/2018 2003   LABSPEC 1.026 08/14/2018 2003   PHURINE 6.0 08/14/2018 2003   GLUCOSEU NEGATIVE 08/14/2018 2003   HGBUR SMALL (A) 08/14/2018 2003   BILIRUBINUR NEGATIVE 08/14/2018 2003   KETONESUR 5 (A) 08/14/2018 2003   PROTEINUR 100 (A) 08/14/2018 2003   NITRITE NEGATIVE  08/14/2018 2003   LEUKOCYTESUR NEGATIVE 08/14/2018 2003   Sepsis Labs Invalid input(s): PROCALCITONIN,  WBC,  LACTICIDVEN Microbiology No results found for this or any previous visit (from the past 240 hour(s)).   Time coordinating discharge: 81mns  SIGNED:   JKathie Dike MD  [facility] 08/17/2018, 8:30 PM   If 7PM-7AM, please contact night-coverage [email or web address]

## 2018-08-18 ENCOUNTER — Encounter (HOSPITAL_COMMUNITY): Payer: Self-pay | Admitting: Emergency Medicine

## 2018-08-18 ENCOUNTER — Other Ambulatory Visit: Payer: Self-pay

## 2018-08-18 ENCOUNTER — Emergency Department (HOSPITAL_COMMUNITY)
Admission: EM | Admit: 2018-08-18 | Discharge: 2018-08-18 | Disposition: A | Discharge status: Home / Self Care | Payer: Medicare Other | Attending: Emergency Medicine | Admitting: Emergency Medicine

## 2018-08-18 DIAGNOSIS — F17210 Nicotine dependence, cigarettes, uncomplicated: Secondary | ICD-10-CM | POA: Diagnosis not present

## 2018-08-18 DIAGNOSIS — F5104 Psychophysiologic insomnia: Secondary | ICD-10-CM

## 2018-08-18 DIAGNOSIS — F419 Anxiety disorder, unspecified: Secondary | ICD-10-CM | POA: Diagnosis present

## 2018-08-18 DIAGNOSIS — Z79899 Other long term (current) drug therapy: Secondary | ICD-10-CM | POA: Insufficient documentation

## 2018-08-18 DIAGNOSIS — G4700 Insomnia, unspecified: Secondary | ICD-10-CM | POA: Diagnosis not present

## 2018-08-18 DIAGNOSIS — F329 Major depressive disorder, single episode, unspecified: Secondary | ICD-10-CM | POA: Diagnosis not present

## 2018-08-18 MED ORDER — HYDROXYZINE HCL 25 MG PO TABS: 25.0000 mg | ORAL_TABLET | Freq: Four times a day (QID) | ORAL | 0 refills | Status: DC | PRN

## 2018-08-18 MED ORDER — HYDROXYZINE HCL 25 MG PO TABS
50.0000 mg | ORAL_TABLET | Freq: Once | ORAL | Status: AC
  Administered 2018-08-18: 50 mg via ORAL
  Filled 2018-08-18: qty 2

## 2018-08-18 NOTE — ED Provider Notes (Signed)
[facility] EMERGENCY DEPARTMENT Provider Note   CSN: [id number] Arrival date & time: 08/18/18  1326     History   Chief Complaint Chief Complaint  Patient presents with  . Withdrawal    HPI Waynetta PeanGary Duvall is a 48 y.o. male.  HPI Pt was seen at 1830. Per pt, c/o gradual onset and persistence of constant acute flair of his chronic anxiety and chronic insomnia since yesterday. Pt states he was discharged from the hospital yesterday for etoh withdrawal. States he was drinking etoh to sleep when his marijuana ran out and he could not obtain more. States he was discharged with 3 tablets of ativan "to help me get through until today."  Pt [facility], but he "didn't have my medicare card" so they did not evaluate him. States he is supposed to reschedule. Pt is in the ED today requesting medications for anxiety and sleep. Denies SI/HI, no AVH. Denies etoh withdrawal symptoms. Denies abd pain, no N/V/D, no CP/SOB, no fevers.    Past Medical History:  Diagnosis Date  . Anxiety   . Depression   . Insomnia   . PTSD (post-traumatic stress disorder)   . RLS (restless legs syndrome)   . Seizures (HCC)   . Sleep apnea   . Stomach ulcer     Patient Active Problem List   Diagnosis Date Noted  . Polysubstance abuse (HCC) 08/16/2018  . RLS (restless legs syndrome) 08/16/2018  . PTSD (post-traumatic stress disorder) 08/16/2018  . Insomnia 08/16/2018  . Alcohol withdrawal (HCC) 08/14/2018    Past Surgical History:  Procedure Laterality Date  . ABDOMINAL SURGERY    . GANGLION CYST EXCISION    . KNEE SURGERY          Home Medications    Prior to Admission medications   Medication Sig Start Date End Date Taking? Authorizing Provider  LORazepam (ATIVAN) 1 MG tablet Take 1 tablet (1 mg total) by mouth 2 (two) times daily as needed for anxiety. 08/17/18   Erick BlinksMemon, Jehanzeb, MD  nicotine polacrilex (NICORETTE) 2 MG gum Take 1 each (2 mg  total) by mouth as needed for smoking cessation. 08/17/18   Erick BlinksMemon, Jehanzeb, MD  rOPINIRole (REQUIP) 0.5 MG tablet Take 1 tablet (0.5 mg total) by mouth 2 (two) times daily. Patient taking differently: Take 1 mg by mouth at bedtime.  05/20/18   Horton, Mayer Maskerourtney F, MD    Family History Family History  Problem Relation Age of Onset  . Hyperlipidemia Mother   . Hypertension Mother   . Depression Mother   . Anxiety disorder Mother     Social History Social History   Tobacco Use  . Smoking status: Current Every Day Smoker    Packs/day: 1.00    Years: 1.00    Pack years: 1.00    Types: Cigarettes    Last attempt to quit: 05/02/2018    Years since quitting: 0.2  . Smokeless tobacco: Never Used  Substance Use Topics  . Alcohol use: Yes    Comment: occasional  . Drug use: Yes    Types: Marijuana     Allergies   Patient has no known allergies.   Review of Systems Review of Systems ROS: Statement: All systems negative except as marked or noted in the HPI; Constitutional: Negative for fever and chills. ; ; Eyes: Negative for eye pain, redness and discharge. ; ; ENMT: Negative for ear pain, hoarseness, nasal congestion, sinus pressure and  sore throat. ; ; Cardiovascular: Negative for chest pain, palpitations, diaphoresis, dyspnea and peripheral edema. ; ; Respiratory: Negative for cough, wheezing and stridor. ; ; Gastrointestinal: Negative for nausea, vomiting, diarrhea, abdominal pain, blood in stool, hematemesis, jaundice and rectal bleeding. . ; ; Genitourinary: Negative for dysuria, flank pain and hematuria. ; ; Musculoskeletal: Negative for back pain and neck pain. Negative for swelling and trauma.; ; Skin: Negative for pruritus, rash, abrasions, blisters, bruising and skin lesion.; ; Neuro: Negative for headache, lightheadedness and neck stiffness. Negative for weakness, altered level of consciousness, altered mental status, extremity weakness, paresthesias, involuntary movement,  seizure and syncope.; Psych:  +anxiety. No SI, no SA, no HI, no hallucinations.        Physical Exam Updated Vital Signs BP (!) 137/96 (BP Location: Right Arm)   Pulse 93   Temp (!) 96.7 F (35.9 C) (Temporal)   Resp 20   Ht 5\' 8"  (1.727 m)   Wt 79.4 kg   SpO2 99%   BMI 26.61 kg/m    BP (!) 122/91 (BP Location: Left Arm)   Pulse 86   Temp (!) 96.7 F (35.9 C) (Temporal)   Resp 18   Ht 5\' 8"  (1.727 m)   Wt 79.4 kg   SpO2 98%   BMI 26.61 kg/m    Physical Exam 1835: Physical examination:  Nursing notes reviewed; Vital signs and O2 SAT reviewed;  Constitutional: Well developed, Well nourished, Well hydrated, In no acute distress; Head:  Normocephalic, atraumatic; Eyes: EOMI, PERRL, No scleral icterus; ENMT: Mouth and pharynx normal, Mucous membranes moist; Neck: Supple, Full range of motion, No lymphadenopathy; Cardiovascular: Regular rate and rhythm, No gallop; Respiratory: Breath sounds clear & equal bilaterally, No wheezes.  Speaking full sentences with ease, Normal respiratory effort/excursion; Chest: Nontender, Movement normal; Abdomen: Soft, Nontender, Nondistended, Normal bowel sounds; Genitourinary: No CVA tenderness; Extremities: Peripheral pulses normal, No tenderness, No edema, No calf edema or asymmetry.; Neuro: AA&Ox3, Major CN grossly intact.  Speech clear. No gross focal motor or sensory deficits in extremities. Climbs on and off stretcher easily by himself. Gait steady..; Skin: Color normal, Warm, Dry.; Psych:  Anxious.     ED Treatments / Results  Labs (all labs ordered are listed, but only abnormal results are displayed)   EKG None  Radiology   Procedures Procedures (including critical care time)  Medications Ordered in ED Medications  hydrOXYzine (ATARAX/VISTARIL) tablet 50 mg (has no administration in time range)     Initial Impression / Assessment and Plan / ED Course  I have reviewed the triage vital signs and the nursing notes.  Pertinent  labs & imaging results that were available during my care of the patient were reviewed by me and considered in my medical decision making (see chart for details).  MDM Reviewed: previous chart, nursing note and vitals Reviewed previous: labs    1840:  Pt requesting medication for anxiety and insomnia; states "that's the only reason why I'm here." Denies any further etoh withdrawal symptoms. Does not appear in etoh withdrawal. Denies SI/HI/AVH. Pt made aware ED would not be the appropriate venue to fill benzodiazepine prescriptions, and that I would tx his symptoms with another medication. Pt verb understanding, requesting dose of "whatever you're going to prescribe me now and then I'll get out of here." Strongly encouraged to [facility]. Dx d/w pt and family.  Questions answered.  Verb understanding, agreeable to d/c home with outpt f/u.     Final Clinical Impressions(s) /  ED Diagnoses   Final diagnoses:  None    ED Discharge Orders    None       Samuel Jester, DO 08/22/18 2043

## 2018-08-18 NOTE — ED Triage Notes (Signed)
Patient reports withdrawal treatment, released yesterday. Patient reports dizziness and shaking. Patient wants medication for insomnia. States he went to [facility] today but was unable to get in to see anyone.

## 2018-08-18 NOTE — Discharge Instructions (Signed)
Substance Abuse Treatment Programs ° °Intensive Outpatient Programs °[facility]     °[address]      °[place], [place]                   °[phone]      ° °[facility] °[address] °[place], [place] °[phone] ° °[facility]     °(Inpatient and outpatient)     °[address]           °[phone]   ° °[facility] °[phone] (Suboxone and Methadone) ° °[address]      °[place], [place] [address]      °[phone]      ° °[address] °[place], [place] °852-3033 ° °[facility] (Outpatient/Inpatient, [facility])    °(insurance only) [phone]      °       °[facility] (Groups & Residential) °[place], [place] °[phone] ° °   °[facility]     °[address]     °[place], [place]      °[phone]      ° °[facility] (for caregivers and family) °[address] °[place], [place] °[phone] ° ° ° ° ° °Residential Treatment Programs °[facility]      °[address], [place], [place] [address]  °[phone]      ° °[facility] °[address] [place], [place] [address] °[phone] ° °[facility]        °[phone]      ° °[facility] °[phone] ° °[facility]             °[address]                                         °[place], [place]                                                °[phone] or [phone]                              ° °[facility] [place] °[address] °[place], [address] °[phone] ° °[facility]    °[address]      °, [place]     °[phone]      ° °[facility] °[address] °, [place] °[phone] ° °[facility]   °[address]     °[place], [place] [address]     °[phone]      °Admissions: 8am-3pm M-F ° °[facility] °[address] °[place],  [place] °[phone] ° °[facility]: Residential Program (90 Days)   °[place], [place]      °[phone] or [phone]    ° °[facility] °[place], [place] °(Walk in Hours over the weekend or by referral) ° °[facility] °[address], [place], [place] [address] °([phone] ° °Crisis Mobile: [facility]:  1-877-626-1772 (for crisis response 24 hours a day) °[facility] Hotline:      1-800-256-2452 °Outpatient Psychiatry and Counseling ° °[facility]: Mobile Crisis   Management 24 hours:  1-579-867-5796  [facility] of the [employer] sliding scale fee and walk in schedule: M-F 8am-12pm/1pm-3pm [address]  [place], [place] [address] [place], [place] [address] [phone]  [facility] ([facility])- new patient walk-in appointments available Monday - Friday 8am -3pm.          [address] [place], [place] [address] [phone] or crisis line- [facility]/ Intensive Outpatient Therapy Program [place], [place] [address] [place]      [phone] [place] [place], [place] [address]                 [place]   [facility] [phone] [place], [place] [address]   [employer] of Care          [address] Johnette Abraham  [place], [place] [address]       [phone]  [facility] [address] [place], [place] [address] [phone]  [facility]    [address] [place], [place] [address]     [place], [place] Joycelyn Man     [place] [address]     [phone]       [facility] [place] [address]  [facility]     [address], [place], [place], [place] [address] [place]     [address]     [place], [place] [address]     [phone]       [facility] [address] [place], [place] [address] [phone] Resources for Temporary Residential Assistance/Crisis [facility]) M-F 8am-3pm   [address] [place], [place] [address]   [phone] Services include: laundry, barbering, support groups, case management, phone  & computer access, showers, AA/NA mtgs, mental health/substance abuse nurse, job skills class, disability information, VA assistance, spiritual classes, etc.   [facility]   [address], [place]     [place]              [employer] (women and children)       [place]. [place], [place] [address] [phone] [email or web address] for application and process Application Required  [facility]   [address]    [place] [address]     [phone]                    [place],  [address] [phone](OILNZVJK application appt.) Application Required  [facility] (women only)    [address]     [place],  [address]     [phone]  Intake starts 6pm daily Need valid ID, SSC, & Police report [facility] [place] [address] [place], [place] [phone] Application Required  [facility] (men only)     [address]      [place], [place]     [phone]       [facility] (Pregnant women only) [address] [place], [place] [phone]  [facility]      [address]      [place], [place] [address]     [phone]             [facility] [address] [place], [place] [phone] 90 day commitment/SA/Application process  [facility](men only)     [address]     [place], [place]     [phone]       Check-in at [facility]7pm            [facility] [address] [place], [place] [id number] [phone] Men/Women/Women and Children must be there by 7 pm  [facility] [place], [place] [phone]                  Take the prescription as directed.  Call your regular medical doctor tomorrow to schedule a follow up appointment within the week.  [facility] tomorrow to schedule a follow up appointment within the week. Return to the Emergency Department immediately sooner if worsening.

## 2018-08-19 LAB — RNA QUALITATIVE: HIV 1 RNA Qualitative: 1

## 2018-08-19 LAB — HIV 1/2 AB DIFFERENTIATION
HIV 1 Ab: NEGATIVE
HIV 2 AB: NEGATIVE
Note: NEGATIVE

## 2018-08-19 LAB — HIV ANTIBODY (ROUTINE TESTING W REFLEX): HIV Screen 4th Generation wRfx: REACTIVE — AB

## 2018-08-25 ENCOUNTER — Other Ambulatory Visit: Payer: Self-pay

## 2018-08-25 ENCOUNTER — Encounter (HOSPITAL_COMMUNITY): Payer: Self-pay

## 2018-08-25 ENCOUNTER — Emergency Department (HOSPITAL_COMMUNITY)
Admission: EM | Admit: 2018-08-25 | Discharge: 2018-08-25 | Disposition: A | Discharge status: Home / Self Care | Payer: Medicare Other | Attending: Emergency Medicine | Admitting: Emergency Medicine

## 2018-08-25 DIAGNOSIS — F1010 Alcohol abuse, uncomplicated: Secondary | ICD-10-CM | POA: Insufficient documentation

## 2018-08-25 DIAGNOSIS — G4700 Insomnia, unspecified: Secondary | ICD-10-CM

## 2018-08-25 DIAGNOSIS — Z79899 Other long term (current) drug therapy: Secondary | ICD-10-CM | POA: Insufficient documentation

## 2018-08-25 DIAGNOSIS — Z87891 Personal history of nicotine dependence: Secondary | ICD-10-CM | POA: Diagnosis not present

## 2018-08-25 MED ORDER — CHLORDIAZEPOXIDE HCL 25 MG PO CAPS: ORAL_CAPSULE | ORAL | 0 refills | Status: DC

## 2018-08-25 NOTE — ED Triage Notes (Addendum)
Pt reports he has been drinking for 2 weeks . Reports he has not had any marijuana to smoke so he is drinking to help himself sleep and it has gotten out of control. Pt reports that he feels shaky. Last drink last night. Reports 4-6 beers nightly

## 2018-08-25 NOTE — Discharge Instructions (Addendum)
Please take the Librium exactly as prescribed, you will need to follow-up with a family doctor or the health department, please see the attached follow-up information for their number.  Please do not drink alcohol and take this medication, if you do it can cause serious harm and even death.  [facility] Primary Care Doctor List    Kari Baars MD. Specialty: Pulmonary Disease Contact information: [address]  [address]  [place] [address]  [phone]   Syliva Overman, MD. Specialty: [facility] Medicine Contact information: [address]  [place] [address]  [phone]   Lilyan Punt, MD. Specialty: [facility] Medicine Contact information: [address]  [place] [address]  [phone]   Avon Gully, MD Specialty: Internal Medicine Contact information: [address] [place] [address]  [phone]   Catalina Pizza, MD. Specialty: Internal Medicine Contact information: [address]  [place] [address]  [phone]    [facility] (Dr. Selena Batten) Specialty: Family Medicine Contact information: [address]  [place] [address]  [phone]   John Giovanni, MD. Specialty: [facility] Medicine Contact information: [address]  [address]  [place] [address]  [phone]   Carylon Perches, MD. Specialty: Internal Medicine Contact information: [address]  [address]  [place] [address]  [phone]    [facility]  [address] [place], [place] [address] [phone]  Services [facility] offers a variety of basic health services.  Services include but are not limited to: Blood pressure checks  Heart rate checks  Blood sugar checks  Urine analysis  Rapid strep tests  Pregnancy tests.  Health education and referrals  People needing more complex services will be directed to a physician online. Using these virtual  visits, doctors can evaluate and prescribe medicine and treatments. There will be no medication on-site, though [facility] will help patients fill their prescriptions at little to no cost.   For More information please go to: [email or web address]

## 2018-08-25 NOTE — ED Provider Notes (Signed)
[facility] EMERGENCY DEPARTMENT Provider Note   CSN: [id number] Arrival date & time: 08/25/18  1352     History   Chief Complaint Chief Complaint  Patient presents with  . alcohol detox    HPI Andrew Parks is a 48 y.o. male.  HPI  The patient is a 48 year old male, he has a known history of posttraumatic stress disorder, restless leg syndrome and chronic insomnia.  Unfortunately the patient has been self-medicating with alcohol to treat his insomnia since he cannot get any more marijuana stating that his dealer disappeared.  Review of the medical record shows that he was actually admitted to the hospital on January 23, he apparently came back on the 27th because of his same chronic symptoms.  He reports that immediately upon discharge he started drinking again but reports that his alcohol abuse has really only been since he ran out of marijuana within the last couple of weeks.  Of note the patient is not from here, he states he is from [place] and only here visiting, his first evaluation here after being seen in [place] in August was in October.  The patient reports that he is not having any withdrawal symptoms anymore.  He felt like he was shaky earlier in the day but that has resolved.  He denies taking any medications except for his restless leg syndrome medication which she takes at night.  Unfortunately he states he cannot take Benadryl because it makes the restless leg syndrome worse, he cannot take melatonin because it does not help and he is requesting medications to help with his insomnia so that he does not have to drink alcohol.  He denies any other physical symptoms  Past Medical History:  Diagnosis Date  . Anxiety   . Depression   . Insomnia   . PTSD (post-traumatic stress disorder)   . RLS (restless legs syndrome)   . Seizures (HCC)   . Sleep apnea   . Stomach ulcer     Patient Active Problem List   Diagnosis Date Noted  . Polysubstance abuse (HCC) 08/16/2018  . RLS  (restless legs syndrome) 08/16/2018  . PTSD (post-traumatic stress disorder) 08/16/2018  . Insomnia 08/16/2018  . Alcohol withdrawal (HCC) 08/14/2018    Past Surgical History:  Procedure Laterality Date  . ABDOMINAL SURGERY    . GANGLION CYST EXCISION    . KNEE SURGERY          Home Medications    Prior to Admission medications   Medication Sig Start Date End Date Taking? Authorizing Provider  ropinirole (REQUIP) 5 MG tablet Take 10 mg by mouth at bedtime. 07/20/18  Yes [provider]  chlordiazePOXIDE (LIBRIUM) 25 MG capsule 50mg  PO TID x 1D, then 25-50mg  PO BID X 1D, then 25-50mg  PO QD X 1D 08/25/18   Eber HongMiller, Jelan Batterton, MD  hydrOXYzine (ATARAX/VISTARIL) 25 MG tablet Take 1 tablet (25 mg total) by mouth every 6 (six) hours as needed for anxiety. Patient not taking: Reported on 08/25/2018 08/18/18   Samuel JesterMcManus, Kathleen, DO  LORazepam (ATIVAN) 1 MG tablet Take 1 tablet (1 mg total) by mouth 2 (two) times daily as needed for anxiety. Patient not taking: Reported on 08/25/2018 08/17/18   Erick BlinksMemon, Jehanzeb, MD  nicotine polacrilex (NICORETTE) 2 MG gum Take 1 each (2 mg total) by mouth as needed for smoking cessation. Patient not taking: Reported on 08/25/2018 08/17/18   Erick BlinksMemon, Jehanzeb, MD  rOPINIRole (REQUIP) 0.5 MG tablet Take 1 tablet (0.5 mg total) by mouth 2 (  two) times daily. Patient taking differently: Take 1 mg by mouth at bedtime.  05/20/18   Horton, Mayer Maskerourtney F, MD    Family History Family History  Problem Relation Age of Onset  . Hyperlipidemia Mother   . Hypertension Mother   . Depression Mother   . Anxiety disorder Mother     Social History Social History   Tobacco Use  . Smoking status: Former Smoker    Packs/day: 1.00    Years: 1.00    Pack years: 1.00    Types: Cigarettes    Last attempt to quit: 08/08/2018    Years since quitting: 0.0  . Smokeless tobacco: Never Used  Substance Use Topics  . Alcohol use: Yes    Comment: 4-6 beers nightly  . Drug use: Not  Currently    Types: Marijuana     Allergies   Patient has no known allergies.   Review of Systems Review of Systems  All other systems reviewed and are negative.    Physical Exam Updated Vital Signs BP (!) 124/94 (BP Location: Right Arm)   Pulse 94   Temp 98.7 F (37.1 C) (Oral)   Resp 18   Ht 1.727 m (5\' 8" )   Wt 79 kg   BMI 26.48 kg/m   Physical Exam Vitals signs and nursing note reviewed.  Constitutional:      General: He is not in acute distress.    Appearance: He is well-developed.  HENT:     Head: Normocephalic and atraumatic.     Mouth/Throat:     Pharynx: No oropharyngeal exudate.  Eyes:     General: No scleral icterus.       Right eye: No discharge.        Left eye: No discharge.     Conjunctiva/sclera: Conjunctivae normal.     Pupils: Pupils are equal, round, and reactive to light.  Neck:     Musculoskeletal: Normal range of motion and neck supple.     Thyroid: No thyromegaly.     Vascular: No JVD.  Cardiovascular:     Rate and Rhythm: Normal rate and regular rhythm.     Heart sounds: Normal heart sounds. No murmur. No friction rub. No gallop.   Pulmonary:     Effort: Pulmonary effort is normal. No respiratory distress.     Breath sounds: Normal breath sounds. No wheezing or rales.  Abdominal:     General: Bowel sounds are normal. There is no distension.     Palpations: Abdomen is soft. There is no mass.     Tenderness: There is no abdominal tenderness.  Musculoskeletal: Normal range of motion.        General: No tenderness.  Lymphadenopathy:     Cervical: No cervical adenopathy.  Skin:    General: Skin is warm and dry.     Findings: No erythema or rash.  Neurological:     Mental Status: He is alert.     Coordination: Coordination normal.     Comments: The patient does not have any tremor, his gait is stable, his sensation and strength is normal in the upper and lower extremities and there is no facial droop, his speech is clear    Psychiatric:        Behavior: Behavior normal.      ED Treatments / Results  Labs (all labs ordered are listed, but only abnormal results are displayed) Labs Reviewed - No data to display  EKG None  Radiology No results found.  Procedures Procedures (including critical care time)  Medications Ordered in ED Medications - No data to display   Initial Impression / Assessment and Plan / ED Course  I have reviewed the triage vital signs and the nursing notes.  Pertinent labs & imaging results that were available during my care of the patient were reviewed by me and considered in my medical decision making (see chart for details).    The patient is perseverating on the fact that he is not being prescribed a formal sleep medication feeling like this is the only thing that will keep him from drinking alcohol.  I told him that it would be unsafe and inappropriate to prescribe a sleep aid that is a benzodiazepine until he had no more alcohol in his system and was through the withdrawal phase.  I will prescribe a Librium taper, I will give him phone numbers for local doctors, he has been seen [facility] in the past and can follow back up there as well.  He has expressed his understanding.  At this time I am hoping and praying that he will stick to his word that he will not drink anymore alcohol but in the meantime a 3 or 4-day taper of Librium will be prescribed.  The patient expressed his understanding to the possible complications of this medication if he were to drink with it.  At this time he has medical decision-making capacity.  I see no signs of alcohol withdrawal or even delirium tremens at this time.  Final Clinical Impressions(s) / ED Diagnoses   Final diagnoses:  Alcohol abuse  Insomnia, unspecified type    ED Discharge Orders         Ordered    chlordiazePOXIDE (LIBRIUM) 25 MG capsule     08/25/18 1804           Eber Hong, MD 08/25/18 1805

## 2018-09-04 ENCOUNTER — Observation Stay (HOSPITAL_COMMUNITY)
Admission: EM | Admit: 2018-09-04 | Discharge: 2018-09-05 | Disposition: A | Discharge status: Home / Self Care | Payer: Medicare Other | Attending: Family Medicine | Admitting: Family Medicine

## 2018-09-04 ENCOUNTER — Other Ambulatory Visit: Payer: Self-pay

## 2018-09-04 ENCOUNTER — Emergency Department (HOSPITAL_COMMUNITY): Payer: Medicare Other

## 2018-09-04 ENCOUNTER — Encounter (HOSPITAL_COMMUNITY): Payer: Self-pay | Admitting: *Deleted

## 2018-09-04 ENCOUNTER — Observation Stay (HOSPITAL_COMMUNITY): Payer: Medicare Other

## 2018-09-04 DIAGNOSIS — F329 Major depressive disorder, single episode, unspecified: Secondary | ICD-10-CM | POA: Diagnosis not present

## 2018-09-04 DIAGNOSIS — G9340 Encephalopathy, unspecified: Secondary | ICD-10-CM

## 2018-09-04 DIAGNOSIS — G9341 Metabolic encephalopathy: Principal | ICD-10-CM | POA: Diagnosis present

## 2018-09-04 DIAGNOSIS — R451 Restlessness and agitation: Secondary | ICD-10-CM | POA: Diagnosis not present

## 2018-09-04 DIAGNOSIS — F4310 Post-traumatic stress disorder, unspecified: Secondary | ICD-10-CM | POA: Diagnosis not present

## 2018-09-04 DIAGNOSIS — F1010 Alcohol abuse, uncomplicated: Secondary | ICD-10-CM | POA: Diagnosis present

## 2018-09-04 DIAGNOSIS — G4700 Insomnia, unspecified: Secondary | ICD-10-CM | POA: Insufficient documentation

## 2018-09-04 DIAGNOSIS — T50902A Poisoning by unspecified drugs, medicaments and biological substances, intentional self-harm, initial encounter: Secondary | ICD-10-CM | POA: Insufficient documentation

## 2018-09-04 DIAGNOSIS — X58XXXA Exposure to other specified factors, initial encounter: Secondary | ICD-10-CM | POA: Insufficient documentation

## 2018-09-04 DIAGNOSIS — G2581 Restless legs syndrome: Secondary | ICD-10-CM | POA: Diagnosis not present

## 2018-09-04 DIAGNOSIS — J322 Chronic ethmoidal sinusitis: Secondary | ICD-10-CM | POA: Diagnosis not present

## 2018-09-04 DIAGNOSIS — G4730 Sleep apnea, unspecified: Secondary | ICD-10-CM | POA: Insufficient documentation

## 2018-09-04 DIAGNOSIS — Z8249 Family history of ischemic heart disease and other diseases of the circulatory system: Secondary | ICD-10-CM | POA: Insufficient documentation

## 2018-09-04 DIAGNOSIS — R7401 Elevation of levels of liver transaminase levels: Secondary | ICD-10-CM | POA: Diagnosis present

## 2018-09-04 DIAGNOSIS — J320 Chronic maxillary sinusitis: Secondary | ICD-10-CM | POA: Insufficient documentation

## 2018-09-04 DIAGNOSIS — F1910 Other psychoactive substance abuse, uncomplicated: Secondary | ICD-10-CM | POA: Diagnosis not present

## 2018-09-04 DIAGNOSIS — Z8719 Personal history of other diseases of the digestive system: Secondary | ICD-10-CM | POA: Diagnosis not present

## 2018-09-04 DIAGNOSIS — Z79899 Other long term (current) drug therapy: Secondary | ICD-10-CM | POA: Diagnosis not present

## 2018-09-04 DIAGNOSIS — R740 Nonspecific elevation of levels of transaminase and lactic acid dehydrogenase [LDH]: Secondary | ICD-10-CM | POA: Diagnosis not present

## 2018-09-04 DIAGNOSIS — Z87891 Personal history of nicotine dependence: Secondary | ICD-10-CM | POA: Diagnosis not present

## 2018-09-04 LAB — BLOOD GAS, VENOUS
Acid-Base Excess: 0.8 mmol/L (ref 0.0–2.0)
Bicarbonate: 24.9 mmol/L (ref 20.0–28.0)
FIO2: 28
O2 Saturation: 90.8 %
PH VEN: 7.395 (ref 7.250–7.430)
Patient temperature: 36.2
pCO2, Ven: 41.9 mmHg — ABNORMAL LOW (ref 44.0–60.0)
pO2, Ven: 71.1 mmHg — ABNORMAL HIGH (ref 32.0–45.0)

## 2018-09-04 LAB — COMPREHENSIVE METABOLIC PANEL
ALT: 106 U/L — ABNORMAL HIGH (ref 0–44)
ALT: 100 U/L — ABNORMAL HIGH (ref 0–44)
AST: 47 U/L — ABNORMAL HIGH (ref 15–41)
AST: 48 U/L — ABNORMAL HIGH (ref 15–41)
Albumin: 4 g/dL (ref 3.5–5.0)
Albumin: 3.8 g/dL (ref 3.5–5.0)
Alkaline Phosphatase: 75 U/L (ref 38–126)
Alkaline Phosphatase: 70 U/L (ref 38–126)
Anion gap: 11 (ref 5–15)
Anion gap: 8 (ref 5–15)
BUN: 17 mg/dL (ref 6–20)
BUN: 16 mg/dL (ref 6–20)
CHLORIDE: 109 mmol/L (ref 98–111)
CO2: 23 mmol/L (ref 22–32)
CO2: 22 mmol/L (ref 22–32)
Calcium: 9.3 mg/dL (ref 8.9–10.3)
Calcium: 8.8 mg/dL — ABNORMAL LOW (ref 8.9–10.3)
Chloride: 109 mmol/L (ref 98–111)
Creatinine, Ser: 0.76 mg/dL (ref 0.61–1.24)
Creatinine, Ser: 0.72 mg/dL (ref 0.61–1.24)
GFR calc Af Amer: >60 mL/min (ref >60)
GFR calc Af Amer: >60 mL/min (ref >60)
GFR calc non Af Amer: >60 mL/min (ref >60)
GFR calc non Af Amer: >60 mL/min (ref >60)
GLUCOSE: 125 mg/dL — AB (ref 70–99)
Glucose, Bld: 89 mg/dL (ref 70–99)
Potassium: 4.1 mmol/L (ref 3.5–5.1)
Potassium: 3.6 mmol/L (ref 3.5–5.1)
SODIUM: 143 mmol/L (ref 135–145)
Sodium: 139 mmol/L (ref 135–145)
Total Bilirubin: 0.4 mg/dL (ref 0.3–1.2)
Total Bilirubin: 0.5 mg/dL (ref 0.3–1.2)
Total Protein: 7.6 g/dL (ref 6.5–8.1)
Total Protein: 7.1 g/dL (ref 6.5–8.1)

## 2018-09-04 LAB — CBC WITH DIFFERENTIAL/PLATELET
Abs Immature Granulocytes: 0.04 10*3/uL (ref 0.00–0.07)
Basophils Absolute: 0.1 10*3/uL (ref 0.0–0.1)
Basophils Relative: 1 %
Eosinophils Absolute: 0.1 10*3/uL (ref 0.0–0.5)
Eosinophils Relative: 1 %
HCT: 39.7 % (ref 39.0–52.0)
Hemoglobin: 12.5 g/dL — ABNORMAL LOW (ref 13.0–17.0)
Immature Granulocytes: 1 %
Lymphocytes Relative: 17 %
Lymphs Abs: 1.4 10*3/uL (ref 0.7–4.0)
MCH: 31.4 pg (ref 26.0–34.0)
MCHC: 31.5 g/dL (ref 30.0–36.0)
MCV: 99.7 fL (ref 80.0–100.0)
MONOS PCT: 14 %
Monocytes Absolute: 1.2 10*3/uL — ABNORMAL HIGH (ref 0.1–1.0)
Neutro Abs: 5.9 10*3/uL (ref 1.7–7.7)
Neutrophils Relative %: 66 %
Platelets: 310 10*3/uL (ref 150–400)
RBC: 3.98 MIL/uL — ABNORMAL LOW (ref 4.22–5.81)
RDW: 11.9 % (ref 11.5–15.5)
WBC: 8.7 10*3/uL (ref 4.0–10.5)
nRBC: 0 % (ref 0.0–0.2)

## 2018-09-04 LAB — RAPID URINE DRUG SCREEN, HOSP PERFORMED
Amphetamines: NOT DETECTED
BARBITURATES: NOT DETECTED
Benzodiazepines: POSITIVE — AB
Cocaine: NOT DETECTED
Opiates: NOT DETECTED
Tetrahydrocannabinol: POSITIVE — AB

## 2018-09-04 LAB — URINALYSIS, ROUTINE W REFLEX MICROSCOPIC
Bacteria, UA: NONE SEEN
Bilirubin Urine: NEGATIVE
Glucose, UA: NEGATIVE mg/dL
Ketones, ur: NEGATIVE mg/dL
Leukocytes,Ua: NEGATIVE
Nitrite: NEGATIVE
Protein, ur: NEGATIVE mg/dL
RBC / HPF: >50 RBC/hpf — ABNORMAL HIGH (ref 0–5)
Specific Gravity, Urine: 1.014 (ref 1.005–1.030)
pH: 6 (ref 5.0–8.0)

## 2018-09-04 LAB — CK: Total CK: 535 U/L — ABNORMAL HIGH (ref 49–397)

## 2018-09-04 LAB — ACETAMINOPHEN LEVEL

## 2018-09-04 LAB — CBG MONITORING, ED: Glucose-Capillary: 107 mg/dL — ABNORMAL HIGH (ref 70–99)

## 2018-09-04 LAB — SALICYLATE LEVEL: Salicylate Lvl: <7 mg/dL (ref 2.8–30.0)

## 2018-09-04 LAB — TROPONIN I: Troponin I: <0.03 ng/mL (ref <0.03)

## 2018-09-04 LAB — ETHANOL

## 2018-09-04 MED ORDER — ENOXAPARIN SODIUM 40 MG/0.4ML ~~LOC~~ SOLN
40.0000 mg | SUBCUTANEOUS | Status: DC
  Administered 2018-09-04: 40 mg via SUBCUTANEOUS
  Filled 2018-09-04: qty 0.4

## 2018-09-04 MED ORDER — VITAMIN B-1 100 MG PO TABS
100.0000 mg | ORAL_TABLET | Freq: Every day | ORAL | Status: DC
  Administered 2018-09-04 – 2018-09-05 (×2): 100 mg via ORAL
  Filled 2018-09-04 (×2): qty 1

## 2018-09-04 MED ORDER — DIPHENHYDRAMINE HCL 50 MG/ML IJ SOLN
50.0000 mg | Freq: Once | INTRAMUSCULAR | Status: AC
  Administered 2018-09-04: 50 mg via INTRAVENOUS
  Filled 2018-09-04: qty 1

## 2018-09-04 MED ORDER — ZIPRASIDONE MESYLATE 20 MG IM SOLR
INTRAMUSCULAR | Status: AC
  Filled 2018-09-04: qty 20

## 2018-09-04 MED ORDER — IOPAMIDOL (ISOVUE-370) INJECTION 76%: 100.0000 mL | Freq: Once | INTRAVENOUS | Status: DC | PRN

## 2018-09-04 MED ORDER — SODIUM CHLORIDE 0.9 % IV SOLN
INTRAVENOUS | Status: DC
  Administered 2018-09-04 – 2018-09-05 (×2): via INTRAVENOUS

## 2018-09-04 MED ORDER — LORAZEPAM 2 MG/ML IJ SOLN
2.0000 mg | Freq: Once | INTRAMUSCULAR | Status: AC
  Administered 2018-09-04: 2 mg via INTRAMUSCULAR
  Filled 2018-09-04: qty 1

## 2018-09-04 MED ORDER — LORAZEPAM 2 MG/ML IJ SOLN
2.0000 mg | Freq: Once | INTRAMUSCULAR | Status: AC
  Administered 2018-09-04: 2 mg via INTRAVENOUS
  Filled 2018-09-04: qty 1

## 2018-09-04 MED ORDER — SODIUM CHLORIDE 0.9 % IV BOLUS
1000.0000 mL | Freq: Once | INTRAVENOUS | Status: AC
  Administered 2018-09-04: 1000 mL via INTRAVENOUS

## 2018-09-04 MED ORDER — LORAZEPAM 2 MG/ML IJ SOLN: 1.0000 mg | Freq: Four times a day (QID) | INTRAMUSCULAR | Status: DC | PRN

## 2018-09-04 MED ORDER — LORAZEPAM 1 MG PO TABS: 1.0000 mg | ORAL_TABLET | Freq: Four times a day (QID) | ORAL | Status: DC | PRN

## 2018-09-04 MED ORDER — NICOTINE 21 MG/24HR TD PT24: 21.0000 mg | MEDICATED_PATCH | Freq: Every day | TRANSDERMAL | Status: DC

## 2018-09-04 MED ORDER — ONDANSETRON HCL 4 MG/2ML IJ SOLN: 4.0000 mg | Freq: Four times a day (QID) | INTRAMUSCULAR | Status: DC | PRN

## 2018-09-04 MED ORDER — THIAMINE HCL 100 MG/ML IJ SOLN: 100.0000 mg | Freq: Every day | INTRAMUSCULAR | Status: DC

## 2018-09-04 MED ORDER — ZIPRASIDONE MESYLATE 20 MG IM SOLR
20.0000 mg | Freq: Once | INTRAMUSCULAR | Status: AC
  Administered 2018-09-04: 20 mg via INTRAMUSCULAR

## 2018-09-04 MED ORDER — ONDANSETRON HCL 4 MG PO TABS: 4.0000 mg | ORAL_TABLET | Freq: Four times a day (QID) | ORAL | Status: DC | PRN

## 2018-09-04 MED ORDER — HALOPERIDOL LACTATE 5 MG/ML IJ SOLN: 2.0000 mg | Freq: Four times a day (QID) | INTRAMUSCULAR | Status: DC | PRN

## 2018-09-04 NOTE — ED Notes (Signed)
Pt awake and alert at present time. Pt girlfriend at bedside. Pt oriented to time and place. One wrist restraint removed for comfort. Pt calm and cooperative.

## 2018-09-04 NOTE — ED Notes (Signed)
Attempted to do TTS. Pt aroused but not alert enough to do assessment.

## 2018-09-04 NOTE — Progress Notes (Signed)
pt sleeing and unable to be aroused per Edson SnowballAngelina, RN to contact TTS when the pt is alert and ready to be assessed.   Princess BruinsAquicha Nikoli Parks, MSW, LCSW Therapeutic Triage Specialist  [phone]

## 2018-09-04 NOTE — ED Triage Notes (Signed)
Pt has history of insomnia. Girlfriend called out due to altered mental status. Pt is very combative, then will fall asleep and snore for 5-10 seconds then just up and become combative again. Refilled prescription for Seroquel yesterday and per EMS, 5 are missing. Security at bedside.

## 2018-09-04 NOTE — H&P (Signed)
TRH H&P   Patient Demographics:    Andrew Parks, is a 48 y.o. male  MRN: [id number]   DOB - 1971/05/21  Admit Date - 09/04/2018  Outpatient Primary MD for the patient is Patient, No Pcp Per  Referring MD: Dr. Jodi Mourning  Outpatient Specialists: None  Patient coming from: Home  Chief Complaint  Patient presents with  . Altered Mental Status      HPI:    Andrew Parks  is a 48 y.o. male, with history of alcohol use, polysubstance abuse, recreational drug use, chronic insomnia, PTSD, restless leg syndrome who was hospitalized from 1/26-1/29 for alcohol withdrawal symptoms and discharged with [facility].  Patient brought to the ED by EMS for altered mental status.  Patient's girlfriend called EMS as patient was combative and agitated.  Per EMS patient would fall asleep in route and wake up agitated and combative.  He did have refill of his Seroquel recently.  Per EMS about 5 pills were missing however when nurse in the ED counted the pills a total of  15 pills were missing.  No other new medication. Patient received Geodon and Ativan in the ED for combativeness and has been somnolent, snoring in the ED during my evaluation so no further history was obtained.  In the ED he was tachypneic, blood pressure elevated to 150/80 mmHg, sat stable on room air.  Blood work showed normal WBC, hemoglobin of 12.5 g/dL, normal platelets, normal electrolytes, LFTs elevated (AST 47, ALT 106), negative for salicylate and Tylenol level, UA showed moderate hemoglobin, urine rapid drug screen was positive for benzos and THC.  CK was elevated at 535.  EKG showed normal sinus rhythm with normal QTC.  Chest x-ray was unremarkable.  CT head was negative for acute finding except for left maxillary and ethmoid sinusitis. Patient given 2 L IV normal saline bolus, total 3 mg of IV Ativan, 20 mg IV  Geodon, 50 mg IV Benadryl and hospitalist consulted for observation on stepdown unit. Poison control was consulted by ED physician who recommended monitoring his labs, repeat EKG to monitor QTC, CK, CMP and Ativan for agitation.     Review of systems:    In addition to the HPI above ROS limited due to patient's encephalopathy.  With Past History of the following :    Past Medical History:  Diagnosis Date  . Anxiety   . Depression   . Insomnia   . PTSD (post-traumatic stress disorder)   . RLS (restless legs syndrome)   . Seizures (HCC)   . Sleep apnea   . Stomach ulcer       Past Surgical History:  Procedure Laterality Date  . ABDOMINAL SURGERY    . GANGLION CYST EXCISION    . KNEE SURGERY        Social History:     Social History  Tobacco Use  . Smoking status: Former Smoker    Packs/day: 1.00    Years: 1.00    Pack years: 1.00    Types: Cigarettes    Last attempt to quit: 08/08/2018    Years since quitting: 0.0  . Smokeless tobacco: Never Used  Substance Use Topics  . Alcohol use: Yes    Comment: 4-6 beers nightly     Lives -home  Mobility -independent    Family History :     Family History  Problem Relation Age of Onset  . Hyperlipidemia Mother   . Hypertension Mother   . Depression Mother   . Anxiety disorder Mother       Home Medications:   Prior to Admission medications   Medication Sig Start Date End Date Taking? Authorizing Provider  chlordiazePOXIDE (LIBRIUM) 25 MG capsule 50mg  PO TID x 1D, then 25-50mg  PO BID X 1D, then 25-50mg  PO QD X 1D 08/25/18   Eber Hong, MD  hydrOXYzine (ATARAX/VISTARIL) 25 MG tablet Take 1 tablet (25 mg total) by mouth every 6 (six) hours as needed for anxiety. Patient not taking: Reported on 08/25/2018 08/18/18   Samuel Jester, DO  LORazepam (ATIVAN) 1 MG tablet Take 1 tablet (1 mg total) by mouth 2 (two) times daily as needed for anxiety. Patient not taking: Reported on 08/25/2018 08/17/18   Erick Blinks, MD  nicotine polacrilex (NICORETTE) 2 MG gum Take 1 each (2 mg total) by mouth as needed for smoking cessation. Patient not taking: Reported on 08/25/2018 08/17/18   Erick Blinks, MD  rOPINIRole (REQUIP) 0.5 MG tablet Take 1 tablet (0.5 mg total) by mouth 2 (two) times daily. Patient taking differently: Take 1 mg by mouth at bedtime.  05/20/18   Horton, Mayer Masker, MD  ropinirole (REQUIP) 5 MG tablet Take 10 mg by mouth at bedtime. 07/20/18   [provider]     Allergies:    No Known Allergies   Physical Exam:   Vitals  Blood pressure (!) 150/80, pulse 85, resp. rate (!) 29, SpO2 100 %.   General: Middle-aged male lying in bed, somnolent, eye opening to painful stimuli HEENT: Pupils reactive bilaterally, no pallor, no icterus, dry mucosa, supple neck, no cervical lymphadenopathy Chest: Clear to auscultation bilaterally, no added sounds CVs: Normal S1 and S2, no murmurs rub or gallop GI: Soft, nondistended, nontender, bowel sounds present Musculoskeletal: Warm, no edema, on ankle restraints CNS: Somnolent, arousable with eye opening to painful stimuli Moving all extremities   Data Review:    CBC Recent Labs  Lab 09/04/18 1107  WBC 8.7  HGB 12.5*  HCT 39.7  PLT 310  MCV 99.7  MCH 31.4  MCHC 31.5  RDW 11.9  LYMPHSABS 1.4  MONOABS 1.2*  EOSABS 0.1  BASOSABS 0.1   ------------------------------------------------------------------------------------------------------------------  Chemistries  Recent Labs  Lab 09/04/18 1107 09/04/18 1407  NA 143 139  K 4.1 3.6  CL 109 109  CO2 23 22  GLUCOSE 125* 89  BUN 17 16  CREATININE 0.76 0.72  CALCIUM 9.3 8.8*  AST 47* 48*  ALT 106* 100*  ALKPHOS 75 70  BILITOT 0.4 0.5   ------------------------------------------------------------------------------------------------------------------ estimated creatinine clearance is 109.3 mL/min (by C-G formula based on SCr of 0.72  mg/dL). ------------------------------------------------------------------------------------------------------------------ No results for input(s): TSH, T4TOTAL, T3FREE, THYROIDAB in the last 72 hours.  Invalid input(s): FREET3  Coagulation profile No results for input(s): INR, PROTIME in the last 168 hours. ------------------------------------------------------------------------------------------------------------------- No results for input(s): DDIMER in the last  72 hours. -------------------------------------------------------------------------------------------------------------------  Cardiac Enzymes Recent Labs  Lab 09/04/18 1407  TROPONINI <0.03   ------------------------------------------------------------------------------------------------------------------ No results found for: BNP   ---------------------------------------------------------------------------------------------------------------  Urinalysis    Component Value Date/Time   COLORURINE YELLOW 09/04/2018 1053   APPEARANCEUR CLEAR 09/04/2018 1053   LABSPEC 1.014 09/04/2018 1053   PHURINE 6.0 09/04/2018 1053   GLUCOSEU NEGATIVE 09/04/2018 1053   HGBUR MODERATE (A) 09/04/2018 1053   BILIRUBINUR NEGATIVE 09/04/2018 1053   KETONESUR NEGATIVE 09/04/2018 1053   PROTEINUR NEGATIVE 09/04/2018 1053   NITRITE NEGATIVE 09/04/2018 1053   LEUKOCYTESUR NEGATIVE 09/04/2018 1053    ----------------------------------------------------------------------------------------------------------------   Imaging Results:    Ct Head Wo Contrast  Result Date: 09/04/2018 CLINICAL DATA:  Altered level of consciousness. EXAM: CT HEAD WITHOUT CONTRAST TECHNIQUE: Contiguous axial images were obtained from the base of the skull through the vertex without intravenous contrast. COMPARISON:  None. FINDINGS: Brain: No evidence of acute infarction, hemorrhage, hydrocephalus, extra-axial collection or mass lesion/mass effect. Vascular: No  hyperdense vessel or unexpected calcification. Skull: Normal. Negative for fracture or focal lesion. Sinuses/Orbits: Left maxillary and ethmoid sinusitis is noted. Other: None. IMPRESSION: Left maxillary and ethmoid sinusitis. No acute intracranial abnormality seen. Electronically Signed   By: Lupita RaiderJames  Green Jr, M.D.   On: 09/04/2018 15:23    My personal review of EKG: Normal sinus rhythm at 100, no ST-T changes.   Assessment & Plan:    Principal Problem:   Acute metabolic encephalopathy Secondary to Seroquel overdose.  Cannot rule out polysubstance abuse. Placed on observation on stepdown monitoring.  Neurochecks every hour for 2 hours then every 2 hours for next 12 hours and then every 4 hours for next 24 hours. Poison control contacted by ED physician who recommended following up his labs including CMP, CK and monitoring his QTC closely. We will place on IV hydration with normal saline at 125 cc/h.  Repeat labs including LFTs and CK in the morning.  Check EKG every 6 hours to monitor QTC closely. Ativan and Haldol as needed for agitation. Monitor on CIWA.  IV thiamine daily. TTS consult. Recruitment consultantafety sitter.  Active Problems:   Polysubstance abuse (HCC) Monitor with neurochecks.  CIWA protocol.  Transaminitis Monitor LFTs closely.  Tobacco abuse Nicotine patch ordered      DVT Prophylaxis: Lovenox  AM Labs Ordered, also please review Full Orders  Family Communication: None at bedside Code Status full code  Likely DC to pending hospital course  Condition GUARDED    Consults called: Telemetry psych  Admission status: Observation  Time spent in minutes : 50   Aylene Acoff M.D on 09/04/2018 at 3:48 PM  Between 7am to 7pm - Pager - [phone]. After 7pm go to [email or web address] - password [email or web address]  [facility] - Office  [phone]

## 2018-09-04 NOTE — ED Notes (Signed)
Call to Poison control "[place]" with advice.  MD made aware. New orders in place.

## 2018-09-04 NOTE — ED Provider Notes (Signed)
[facility]ERGENCY DEPARTMENT Provider Note   CSN: [id number] Arrival date & time: 09/04/18  1032     History   Chief Complaint Chief Complaint  Patient presents with  . Altered Mental Status    HPI Andrew PeanGary Parks is a 48 y.o. male.  Patient with history of anxiety, depression, insomnia, PTSD, substance abuse presents for altered mental status.  Patient's girlfriend called EMS due to altered mental status.  Patient combative on route and would fall asleep and then wake up agitated and combative.  Patient did have refill of Seroquel and per EMS 5 pills are missing.  No other new medications.  Patient is on other medications as well.  Unable to get details from patient.      Past Medical History:  Diagnosis Date  . Anxiety   . Depression   . Insomnia   . PTSD (post-traumatic stress disorder)   . RLS (restless legs syndrome)   . Seizures (HCC)   . Sleep apnea   . Stomach ulcer     Patient Active Problem List   Diagnosis Date Noted  . Acute metabolic encephalopathy 09/04/2018  . Polysubstance abuse (HCC) 08/16/2018  . RLS (restless legs syndrome) 08/16/2018  . PTSD (post-traumatic stress disorder) 08/16/2018  . Insomnia 08/16/2018  . Alcohol withdrawal (HCC) 08/14/2018    Past Surgical History:  Procedure Laterality Date  . ABDOMINAL SURGERY    . GANGLION CYST EXCISION    . KNEE SURGERY          Home Medications    Prior to Admission medications   Medication Sig Start Date End Date Taking? Authorizing Provider  chlordiazePOXIDE (LIBRIUM) 25 MG capsule 50mg  PO TID x 1D, then 25-50mg  PO BID X 1D, then 25-50mg  PO QD X 1D 08/25/18   Eber HongMiller, Brian, MD  hydrOXYzine (ATARAX/VISTARIL) 25 MG tablet Take 1 tablet (25 mg total) by mouth every 6 (six) hours as needed for anxiety. Patient not taking: Reported on 08/25/2018 08/18/18   Samuel JesterMcManus, Kathleen, DO  LORazepam (ATIVAN) 1 MG tablet Take 1 tablet (1 mg total) by mouth 2 (two) times daily as needed for anxiety. Patient not  taking: Reported on 08/25/2018 08/17/18   Erick BlinksMemon, Jehanzeb, MD  nicotine polacrilex (NICORETTE) 2 MG gum Take 1 each (2 mg total) by mouth as needed for smoking cessation. Patient not taking: Reported on 08/25/2018 08/17/18   Erick BlinksMemon, Jehanzeb, MD  rOPINIRole (REQUIP) 0.5 MG tablet Take 1 tablet (0.5 mg total) by mouth 2 (two) times daily. Patient taking differently: Take 1 mg by mouth at bedtime.  05/20/18   Horton, Mayer Maskerourtney F, MD  ropinirole (REQUIP) 5 MG tablet Take 10 mg by mouth at bedtime. 07/20/18   [provider]    Family History Family History  Problem Relation Age of Onset  . Hyperlipidemia Mother   . Hypertension Mother   . Depression Mother   . Anxiety disorder Mother     Social History Social History   Tobacco Use  . Smoking status: Former Smoker    Packs/day: 1.00    Years: 1.00    Pack years: 1.00    Types: Cigarettes    Last attempt to quit: 08/08/2018    Years since quitting: 0.0  . Smokeless tobacco: Never Used  Substance Use Topics  . Alcohol use: Yes    Comment: 4-6 beers nightly  . Drug use: Not Currently    Types: Marijuana     Allergies   Patient has no known allergies.   Review  of Systems Review of Systems  Unable to perform ROS: Mental status change     Physical Exam Updated Vital Signs BP (!) 150/80 (BP Location: Left Arm)   Pulse 85   Resp (!) 29   SpO2 100%   Physical Exam Vitals signs and nursing note reviewed.  Constitutional:      Appearance: He is toxic-appearing.  HENT:     Head: Atraumatic.     Comments: Dry mm    Nose: No congestion.  Eyes:     General: No scleral icterus.    Pupils: Pupils are equal, round, and reactive to light.  Neck:     Musculoskeletal: Normal range of motion and neck supple. No neck rigidity.  Cardiovascular:     Rate and Rhythm: Tachycardia present.     Pulses: Normal pulses.  Pulmonary:     Effort: Pulmonary effort is normal.  Abdominal:     General: There is no distension.      Tenderness: There is no abdominal tenderness.  Musculoskeletal:        General: No deformity or signs of injury.  Skin:    General: Skin is warm.  Neurological:     Comments: Patient agitated, 5+ strength diffusely, unable to do detailed neuro exam as patient minimal verbal and will not follow commands. perrl 2 mm bilateral  Psychiatric:     Comments: Agitated, confused      ED Treatments / Results  Labs (all labs ordered are listed, but only abnormal results are displayed) Labs Reviewed  COMPREHENSIVE METABOLIC PANEL - Abnormal; Notable for the following components:      Result Value   Glucose, Bld 125 (*)    AST 47 (*)    ALT 106 (*)    All other components within normal limits  RAPID URINE DRUG SCREEN, HOSP PERFORMED - Abnormal; Notable for the following components:   Benzodiazepines POSITIVE (*)    Tetrahydrocannabinol POSITIVE (*)    All other components within normal limits  CBC WITH DIFFERENTIAL/PLATELET - Abnormal; Notable for the following components:   RBC 3.98 (*)    Hemoglobin 12.5 (*)    Monocytes Absolute 1.2 (*)    All other components within normal limits  URINALYSIS, ROUTINE W REFLEX MICROSCOPIC - Abnormal; Notable for the following components:   Hgb urine dipstick MODERATE (*)    RBC / HPF >50 (*)    All other components within normal limits  ACETAMINOPHEN LEVEL - Abnormal; Notable for the following components:   Acetaminophen (Tylenol), Serum <10 (*)    All other components within normal limits  BLOOD GAS, VENOUS - Abnormal; Notable for the following components:   pCO2, Ven 41.9 (*)    pO2, Ven 71.1 (*)    All other components within normal limits  CK - Abnormal; Notable for the following components:   Total CK 535 (*)    All other components within normal limits  COMPREHENSIVE METABOLIC PANEL - Abnormal; Notable for the following components:   Calcium 8.8 (*)    AST 48 (*)    ALT 100 (*)    All other components within normal limits  CBG  MONITORING, ED - Abnormal; Notable for the following components:   Glucose-Capillary 107 (*)    All other components within normal limits  ETHANOL  SALICYLATE LEVEL  TROPONIN I    EKG EKG Interpretation  Date/Time:  Thursday September 04 2018 12:24:06 EST Ventricular Rate:  115 PR Interval:    QRS Duration: 79 QT Interval:  329 QTC Calculation: 455 R Axis:   91 Text Interpretation:  Sinus tachycardia Borderline right axis deviation RSR' in V1 or V2, probably normal variant Lead(s) I were not used for morphology analysis Confirmed by Blane Ohara [phone]) on 09/04/2018 1:28:39 PM   Radiology Ct Head Wo Contrast  Result Date: 09/04/2018 CLINICAL DATA:  Altered level of consciousness. EXAM: CT HEAD WITHOUT CONTRAST TECHNIQUE: Contiguous axial images were obtained from the base of the skull through the vertex without intravenous contrast. COMPARISON:  None. FINDINGS: Brain: No evidence of acute infarction, hemorrhage, hydrocephalus, extra-axial collection or mass lesion/mass effect. Vascular: No hyperdense vessel or unexpected calcification. Skull: Normal. Negative for fracture or focal lesion. Sinuses/Orbits: Left maxillary and ethmoid sinusitis is noted. Other: None. IMPRESSION: Left maxillary and ethmoid sinusitis. No acute intracranial abnormality seen. Electronically Signed   By: Lupita Raider, M.D.   On: 09/04/2018 15:23    Procedures .Critical Care Performed by: Blane Ohara, MD Authorized by: Blane Ohara, MD   Critical care provider statement:    Critical care time (minutes):  80   Critical care start time:  09/04/2018 10:40 AM   Critical care end time:  09/04/2018 12:00 PM   Critical care time was exclusive of:  Separately billable procedures and treating other patients and teaching time   Critical care was necessary to treat or prevent imminent or life-threatening deterioration of the following conditions:  Toxidrome   Critical care was time spent personally by me on  the following activities:  Discussions with consultants, evaluation of patient's response to treatment, examination of patient, ordering and performing treatments and interventions, ordering and review of laboratory studies, ordering and review of radiographic studies, pulse oximetry, re-evaluation of patient's condition, obtaining history from patient or surrogate and review of old charts   (including critical care time)  Medications Ordered in ED Medications  sodium chloride 0.9 % bolus 1,000 mL (has no administration in time range)  iopamidol (ISOVUE-370) 76 % injection 100 mL (has no administration in time range)  sodium chloride 0.9 % bolus 1,000 mL (has no administration in time range)  ziprasidone (GEODON) injection 20 mg (20 mg Intramuscular Given 09/04/18 1059)  LORazepam (ATIVAN) injection 2 mg (2 mg Intramuscular Given 09/04/18 1100)  LORazepam (ATIVAN) injection 2 mg (2 mg Intramuscular Given 09/04/18 1147)  LORazepam (ATIVAN) injection 2 mg (2 mg Intravenous Given 09/04/18 1310)  diphenhydrAMINE (BENADRYL) injection 50 mg (50 mg Intravenous Given 09/04/18 1354)     Initial Impression / Assessment and Plan / ED Course  I have reviewed the triage vital signs and the nursing notes.  Pertinent labs & imaging results that were available during my care of the patient were reviewed by me and considered in my medical decision making (see chart for details).   Patient with history of chronic medical problems presents with altered mental status since sometime this morning.  Patient has been seen 10 days ago in the ER requesting benzodiazepines or other sleep aids.  Reviewed medical record.  Patient has a history of alcohol abuse as well.  In order to complete the medical evaluation, obtain blood work, protect the patient and protect staff we had use Ativan/Geodon.  Unable to use verbal de-escalation as patient cannot hold a conversation.  Police/security 2 of them in the room have assisting.   Decision for soft restraints at this time.  On reassessment patient's agitation worsened.  Repeat doses of benzodiazepines given.  Patient unable to have CT scan due to movement.  Repeat  dose of Ativan given again.  Plan for CT scan to look for intracranial abnormalities.   At this point clinically concern for medication related/overdose/serotonin syndrome.  We will continue aggressive supportive care and plan to admit to intermediate or ICU pending patient status.  Nursing staff counted pills missing and patient took approximately 15 Seroquel unsure intent at this time.  Multiple rechecks in the ER and updates.  CT scan reviewed no acute bleeding.  Plan admit to ICU. Poison control was contacted recommended monitoring blood work including CK, CMP, repeat EKG to monitor QRS and QT and Ativan for agitation. Paged hospitalist to admit. The patients results and plan were reviewed and discussed.   Any x-rays performed were independently reviewed by myself.   Differential diagnosis were considered with the presenting HPI.  Medications  sodium chloride 0.9 % bolus 1,000 mL (has no administration in time range)  iopamidol (ISOVUE-370) 76 % injection 100 mL (has no administration in time range)  sodium chloride 0.9 % bolus 1,000 mL (has no administration in time range)  ziprasidone (GEODON) injection 20 mg (20 mg Intramuscular Given 09/04/18 1059)  LORazepam (ATIVAN) injection 2 mg (2 mg Intramuscular Given 09/04/18 1100)  LORazepam (ATIVAN) injection 2 mg (2 mg Intramuscular Given 09/04/18 1147)  LORazepam (ATIVAN) injection 2 mg (2 mg Intravenous Given 09/04/18 1310)  diphenhydrAMINE (BENADRYL) injection 50 mg (50 mg Intravenous Given 09/04/18 1354)    Vitals:   09/04/18 1130 09/04/18 1145 09/04/18 1200 09/04/18 1215  BP:      Pulse:      Resp: (!) 30 (!) 25 (!) 36 (!) 29  SpO2:        Final diagnoses:  Acute encephalopathy  Drug overdose, intentional self-harm, initial encounter (HCC)    Agitation    Admission/ observation were discussed with the admitting physician, patient and/or family and they are comfortable with the plan.    Final Clinical Impressions(s) / ED Diagnoses   Final diagnoses:  Acute encephalopathy  Drug overdose, intentional self-harm, initial encounter [facility])  Agitation    ED Discharge Orders    None       Blane Ohara, MD 09/04/18 1538

## 2018-09-04 NOTE — ED Notes (Signed)
Patient is sleeping at present, significant other at bedside, states he has a history of restless leg syndrome. Gurney cuffs released from ankles. Snoring respirations.

## 2018-09-04 NOTE — ED Notes (Signed)
Pt reports he took extra seroquel to try and sleep. Pt reports he was not trying to kill himself as he doesn't want to die and loves his life. Pt removed out of restraints as he is calm and cooperative and oriented.
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
## 2018-09-04 NOTE — Progress Notes (Signed)
Per Donell Sievert, PA pt is psych cleared and does not meet criteria for inpt treatment. Pt is recommended for d/c and to follow up with OPT providers in order to establish ongoing MH treatment.   TTS to fax OPT resources to the pt. Per Liborio Nixon fax to [phone]. Attempted to inform the EDP at [phone], number given by Edson Snowball but unable to contact.   Princess Bruins, MSW, LCSW Therapeutic Triage Specialist  [phone]

## 2018-09-04 NOTE — Progress Notes (Signed)
Pt now in ICU. Liborio Nixon states nurse will call TTS back when the pt is ready to be seen.

## 2018-09-05 DIAGNOSIS — F1010 Alcohol abuse, uncomplicated: Secondary | ICD-10-CM | POA: Diagnosis not present

## 2018-09-05 DIAGNOSIS — G9341 Metabolic encephalopathy: Secondary | ICD-10-CM | POA: Diagnosis not present

## 2018-09-05 DIAGNOSIS — F1910 Other psychoactive substance abuse, uncomplicated: Secondary | ICD-10-CM | POA: Diagnosis not present

## 2018-09-05 DIAGNOSIS — R740 Nonspecific elevation of levels of transaminase and lactic acid dehydrogenase [LDH]: Secondary | ICD-10-CM | POA: Diagnosis not present

## 2018-09-05 LAB — COMPREHENSIVE METABOLIC PANEL
ALT: 84 U/L — ABNORMAL HIGH (ref 0–44)
AST: 46 U/L — ABNORMAL HIGH (ref 15–41)
Albumin: 3.3 g/dL — ABNORMAL LOW (ref 3.5–5.0)
Alkaline Phosphatase: 75 U/L (ref 38–126)
Anion gap: 7 (ref 5–15)
BILIRUBIN TOTAL: 0.5 mg/dL (ref 0.3–1.2)
BUN: 11 mg/dL (ref 6–20)
CALCIUM: 8.3 mg/dL — AB (ref 8.9–10.3)
CO2: 22 mmol/L (ref 22–32)
Chloride: 110 mmol/L (ref 98–111)
Creatinine, Ser: 0.61 mg/dL (ref 0.61–1.24)
GFR calc Af Amer: >60 mL/min (ref >60)
GFR calc non Af Amer: >60 mL/min (ref >60)
Glucose, Bld: 80 mg/dL (ref 70–99)
Potassium: 3.8 mmol/L (ref 3.5–5.1)
SODIUM: 139 mmol/L (ref 135–145)
Total Protein: 6.5 g/dL (ref 6.5–8.1)

## 2018-09-05 LAB — CK: Total CK: 697 U/L — ABNORMAL HIGH (ref 49–397)

## 2018-09-05 LAB — CBC
HCT: 37.9 % — ABNORMAL LOW (ref 39.0–52.0)
Hemoglobin: 12 g/dL — ABNORMAL LOW (ref 13.0–17.0)
MCH: 31.3 pg (ref 26.0–34.0)
MCHC: 31.7 g/dL (ref 30.0–36.0)
MCV: 99 fL (ref 80.0–100.0)
PLATELETS: 288 10*3/uL (ref 150–400)
RBC: 3.83 MIL/uL — ABNORMAL LOW (ref 4.22–5.81)
RDW: 11.9 % (ref 11.5–15.5)
WBC: 6.3 10*3/uL (ref 4.0–10.5)
nRBC: 0 % (ref 0.0–0.2)

## 2018-09-05 LAB — MRSA PCR SCREENING: MRSA BY PCR: NEGATIVE

## 2018-09-05 NOTE — Clinical Social Work Note (Signed)
CSW consult had been received for AODA concerns. Pt had TTS consult and outpt follow up resources were provided by TTS. Pt discharged home before this LCSW arrived in the room.

## 2018-09-05 NOTE — Progress Notes (Signed)
Patient was told by MD that he is well enough to go home. Patient has taken himself off of the monitor at this time without discharge order in place.

## 2018-09-05 NOTE — Discharge Summary (Signed)
Physician Discharge Summary  Andrew Parks VVO:[id number] DOB: [birth year] DOA: 09/04/2018  PCP: Patient, No Pcp Per  Admit date: 09/04/2018 Discharge date: 09/05/2018  Admitted From: Home  Disposition: Home   Recommendations for Outpatient Follow-up:  1. Follow up with [facility] today or tomorrow strongly recommended  Discharge Condition: STABLE   CODE STATUS: FULL    Brief Hospitalization Summary: Please see all hospital notes, images, labs for full details of the hospitalization. Dr. Valora Piccolo HPI Andrew Parks  is a 48 y.o. male, with history of alcohol use, polysubstance abuse, recreational drug use, chronic insomnia, PTSD, restless leg syndrome who was hospitalized from 1/26-1/29 for alcohol withdrawal symptoms and [facility]  Patient brought to the ED by EMS for altered mental status.  Patient's girlfriend called EMS as patient was combative and agitated.  Per EMS patient would fall asleep in route and wake up agitated and combative.  He did have refill of his Seroquel recently.  Per EMS about 5 pills were missing however when nurse in the ED counted the pills a total of  15 pills were missing.  No other new medication. Patient received Geodon and Ativan in the ED for combativeness and has been somnolent, snoring in the ED during my evaluation so no further history was obtained.  In the ED he was tachypneic, blood pressure elevated to 150/80 mmHg, sat stable on room air.  Blood work showed normal WBC, hemoglobin of 12.5 g/dL, normal platelets, normal electrolytes, LFTs elevated (AST 47, ALT 106), negative for salicylate and Tylenol level, UA showed moderate hemoglobin, urine rapid drug screen was positive for benzos and THC.  CK was elevated at 535.  EKG showed normal sinus rhythm with normal QTC.  Chest x-ray was unremarkable.  CT head was negative for acute finding except for left maxillary and ethmoid sinusitis. Patient given 2 L IV normal saline bolus, total  3 mg of IV Ativan, 20 mg IV Geodon, 50 mg IV Benadryl and hospitalist consulted for observation on stepdown unit.  Poison control was consulted by ED physician who recommended monitoring his labs, repeat EKG to monitor QTC, CK, CMP and Ativan for agitation.    The patient was initially very agitated and required sedation.  However he improved with supportive therapy and he was monitored in the stepdown ICU overnight.  He had serial neuro checks done and improved to his baseline.  His QTc interval was monitored serially and has remained in normal limits.  His telemetry has remained sinus normal.  He is feeling much better.  He was seen by the TTS behavioral health services and they reported and recommended that he be discharged home with outpatient behavioral health follow up.  He had been recommended to go to [facility] which he agreed to do.  He was treated supportively with IV fluids and rest.  His liver enzymes are trending down.  His CK levels less than 1000 and he has been given IV fluid hydration for that and encouraged to drink extra fluids.  He is eating and drinking well. He denies any suicidal or homicidal thought or plan or intentions to harm himself or others.  He was strongly advised to go to [facility] today or tomorrow for further evaluation.  I also advised him that he could return to ER if his symptoms came back, worsened or new problems developed.  He verbalized understanding.  His girlfriend will be with him at home 24/7 to monitor and care for him.  Also encouraged  him to drink extra clear fluids and avoid recreational drugs.  The patient verbalized understanding.    Discharge Diagnoses:  Principal Problem:   Acute metabolic encephalopathy Active Problems:   Polysubstance abuse (HCC)   Alcohol abuse   Transaminitis   Discharge Instructions: Discharge Instructions    Call MD for:  difficulty breathing, headache or visual disturbances   Complete by:  As directed    Call MD for:  extreme  fatigue   Complete by:  As directed    Call MD for:  persistant nausea and vomiting   Complete by:  As directed    Call MD for:  severe uncontrolled pain   Complete by:  As directed    Diet - low sodium heart healthy   Complete by:  As directed    Increase activity slowly   Complete by:  As directed    Increase activity slowly   Complete by:  As directed      Allergies as of 09/05/2018   No Known Allergies     Medication List    STOP taking these medications   ropinirole 5 MG tablet Commonly known as:  REQUIP      Follow-up Information    Services, [facility]. Go in 1 day(s).   Why:  PLEASE GO ASAP TO SEEK FURTHER TREATMENT  Contact information: [address] [place] [address] [phone]          No Known Allergies Allergies as of 09/05/2018   No Known Allergies     Medication List    STOP taking these medications   ropinirole 5 MG tablet Commonly known as:  REQUIP       Procedures/Studies: Ct Head Wo Contrast  Result Date: 09/04/2018 CLINICAL DATA:  Altered level of consciousness. EXAM: CT HEAD WITHOUT CONTRAST TECHNIQUE: Contiguous axial images were obtained from the base of the skull through the vertex without intravenous contrast. COMPARISON:  None. FINDINGS: Brain: No evidence of acute infarction, hemorrhage, hydrocephalus, extra-axial collection or mass lesion/mass effect. Vascular: No hyperdense vessel or unexpected calcification. Skull: Normal. Negative for fracture or focal lesion. Sinuses/Orbits: Left maxillary and ethmoid sinusitis is noted. Other: None. IMPRESSION: Left maxillary and ethmoid sinusitis. No acute intracranial abnormality seen. Electronically Signed   By: Lupita RaiderJames  Green Jr, M.D.   On: 09/04/2018 15:23   Dg Chest Portable 1 View  Result Date: 09/04/2018 CLINICAL DATA:  Altered mental status.  Combative. EXAM: PORTABLE CHEST 1 VIEW COMPARISON:  05/03/2018 FINDINGS: There are prominent bronchovascular markings which are accentuated  by the AP supine technique and low lung volumes. Allowing for this, lungs are clear. Cardiac silhouette is normal in size.  No mediastinal hilar masses. Skeletal structures are grossly intact. IMPRESSION: No acute cardiopulmonary disease. Electronically Signed   By: Amie Portlandavid  Ormond M.D.   On: 09/04/2018 15:55      Subjective: Pt says that he is feeling much better today.  [facility]  He says that his girlfriend will be with him and make sure that he gets to his appointments.     Discharge Exam: Vitals:   09/05/18 0800 09/05/18 0900  BP: 115/83 (!) 137/97  Pulse: 79 93  Resp: (!) 21 (!) 28  Temp:    SpO2: 96% 96%   Vitals:   09/05/18 0645 09/05/18 0748 09/05/18 0800 09/05/18 0900  BP: (!) 126/92  115/83 (!) 137/97  Pulse: 82  79 93  Resp: (!) 28  (!) 21 (!)  28  Temp:  97.6 F (36.4 C)    TempSrc:      SpO2: 96%  96% 96%  Weight:      Height:       General: Pt is alert, awake, not in acute distress Cardiovascular: RRR, S1/S2 +, no rubs, no gallops Respiratory: CTA bilaterally, no wheezing, no rhonchi Abdominal: Soft, NT, ND, bowel sounds + Extremities: no edema, no cyanosis   The results of significant diagnostics from this hospitalization (including imaging, microbiology, ancillary and laboratory) are listed below for reference.     Microbiology: Recent Results (from the past 240 hour(s))  MRSA PCR Screening     Status: None   Collection Time: 09/04/18  8:41 PM  Result Value Ref Range Status   MRSA by PCR NEGATIVE NEGATIVE Final    Comment:        The GeneXpert MRSA Assay (FDA approved for NASAL specimens only), is one component of a comprehensive MRSA colonization surveillance program. It is not intended to diagnose MRSA infection nor to guide or monitor treatment for MRSA infections. Performed at [facility], [address], [place], [place] [address]     Labs: BNP (last 3 results) No results for input(s): BNP in the last  8760 hours. Basic Metabolic Panel: Recent Labs  Lab 09/04/18 1107 09/04/18 1407 09/05/18 0452  NA 143 139 139  K 4.1 3.6 3.8  CL 109 109 110  CO2 23 22 22   GLUCOSE 125* 89 80  BUN 17 16 11   CREATININE 0.76 0.72 0.61  CALCIUM 9.3 8.8* 8.3*   Liver Function Tests: Recent Labs  Lab 09/04/18 1107 09/04/18 1407 09/05/18 0452  AST 47* 48* 46*  ALT 106* 100* 84*  ALKPHOS 75 70 75  BILITOT 0.4 0.5 0.5  PROT 7.6 7.1 6.5  ALBUMIN 4.0 3.8 3.3*   No results for input(s): LIPASE, AMYLASE in the last 168 hours. No results for input(s): AMMONIA in the last 168 hours. CBC: Recent Labs  Lab 09/04/18 1107 09/05/18 0452  WBC 8.7 6.3  NEUTROABS 5.9  --   HGB 12.5* 12.0*  HCT 39.7 37.9*  MCV 99.7 99.0  PLT 310 288   Cardiac Enzymes: Recent Labs  Lab 09/04/18 1407 09/05/18 0452  CKTOTAL 535* 697*  TROPONINI <0.03  --    BNP: Invalid input(s): POCBNP CBG: Recent Labs  Lab 09/04/18 1204  GLUCAP 107*   D-Dimer No results for input(s): DDIMER in the last 72 hours. Hgb A1c No results for input(s): HGBA1C in the last 72 hours. Lipid Profile No results for input(s): CHOL, HDL, LDLCALC, TRIG, CHOLHDL, LDLDIRECT in the last 72 hours. Thyroid function studies No results for input(s): TSH, T4TOTAL, T3FREE, THYROIDAB in the last 72 hours.  Invalid input(s): FREET3 Anemia work up No results for input(s): VITAMINB12, FOLATE, FERRITIN, TIBC, IRON, RETICCTPCT in the last 72 hours. Urinalysis    Component Value Date/Time   COLORURINE YELLOW 09/04/2018 1053   APPEARANCEUR CLEAR 09/04/2018 1053   LABSPEC 1.014 09/04/2018 1053   PHURINE 6.0 09/04/2018 1053   GLUCOSEU NEGATIVE 09/04/2018 1053   HGBUR MODERATE (A) 09/04/2018 1053   BILIRUBINUR NEGATIVE 09/04/2018 1053   KETONESUR NEGATIVE 09/04/2018 1053   PROTEINUR NEGATIVE 09/04/2018 1053   NITRITE NEGATIVE 09/04/2018 1053   LEUKOCYTESUR NEGATIVE 09/04/2018 1053   Sepsis Labs Invalid input(s): PROCALCITONIN,  WBC,   LACTICIDVEN Microbiology Recent Results (from the past 240 hour(s))  MRSA PCR Screening     Status: None   Collection Time: 09/04/18  8:41 PM  Result Value Ref Range Status   MRSA by PCR NEGATIVE NEGATIVE Final    Comment:        The GeneXpert MRSA Assay (FDA approved for NASAL specimens only), is one component of a comprehensive MRSA colonization surveillance program. It is not intended to diagnose MRSA infection nor to guide or monitor treatment for MRSA infections. Performed at [facility], [address] [place], [place] [address]    Time coordinating discharge:   SIGNED:   Standley Dakinslanford Caileb Rhue, MD  [facility] 09/05/2018, 10:31 AM How to contact the [facility] Attending or Consulting provider 7A - 7P or covering provider during after hours 7P -7A, for this patient?  1. Check the care team in [facility] and look for a) attending/consulting [facility] provider listed and b) the [facility] team listed 2. Log into [email or web address] and use [place]'s universal password to access. If you do not have the password, please contact the hospital operator. 3. Locate the [facility] provider you are looking for under [facility] and page to a number that you can be directly reached. 4. If you still have difficulty reaching the provider, please page the [facility] (Director on Call) for the Hospitalists listed on amion for assistance.

## 2018-09-05 NOTE — Discharge Instructions (Signed)
PLEASE GO TO [facility] ASAP TO SEEK FURTHER TREATMENT.    SEEK MEDICAL CARE OR RETURN TO EMERGENCY ROOM IF SYMPTOMS COME BACK, WORSEN OR NEW PROBLEM DEVELOPS.   Please avoid recreational drugs and alcohol at all times.    IMPORTANT INFORMATION: PAY CLOSE ATTENTION   PHYSICIAN DISCHARGE INSTRUCTIONS  Follow with Primary care provider  [facility]  and other consultants as instructed your Hospitalist Physician  SEEK MEDICAL CARE OR RETURN TO EMERGENCY ROOM IF SYMPTOMS COME BACK, WORSEN OR NEW PROBLEM DEVELOPS.   Please note: You were cared for by a hospitalist during your hospital stay. Every effort will be made to forward records to your primary care provider.  You can request that your primary care provider send for your hospital records if they have not received them.  Once you are discharged, your primary care physician will handle any further medical issues. Please note that NO REFILLS for any discharge medications will be authorized once you are discharged, as it is imperative that you return to your primary care physician (or establish a relationship with a primary care physician if you do not have one) for your post hospital discharge needs so that they can reassess your need for medications and monitor your lab values.  Please get a complete blood count and chemistry panel checked by your Primary MD at your next visit, and again as instructed by your Primary MD.  Get Medicines reviewed and adjusted: Please take all your medications with you for your next visit with your Primary MD  Laboratory/radiological data: Please request your Primary MD to go over all hospital tests and procedure/radiological results at the follow up, please ask your primary care provider to get all Hospital records sent to his/her office.  In some cases, they will be blood work, cultures and biopsy results pending at the time of your discharge. Please request that your primary care provider follow up on these  results.  If you are diabetic, please bring your blood sugar readings with you to your follow up appointment with primary care.    Please call and make your follow up appointments as soon as possible.    Also Note the following: If you experience worsening of your admission symptoms, develop shortness of breath, life threatening emergency, suicidal or homicidal thoughts you must seek medical attention immediately by calling 911 or calling your MD immediately  if symptoms less severe.  You must read complete instructions/literature along with all the possible adverse reactions/side effects for all the Medicines you take and that have been prescribed to you. Take any new Medicines after you have completely understood and accpet all the possible adverse reactions/side effects.   Do not drive when taking Pain medications or sleeping medications (Benzodiazepines)  Do not take more than prescribed Pain, Sleep and Anxiety Medications. It is not advisable to combine anxiety,sleep and pain medications without talking with your primary care practitioner  Special Instructions: If you have smoked or chewed Tobacco  in the last 2 yrs please stop smoking, stop any regular Alcohol  and or any Recreational drug use.  Wear Seat belts while driving.

## 2019-07-27 ENCOUNTER — Encounter: Payer: Self-pay | Admitting: Internal Medicine

## 2019-08-08 NOTE — Progress Notes (Deleted)
Referring Provider: Jani Gravel, MD Primary Care Physician:  Jani Gravel, MD Primary Gastroenterologist:  Dr. Gala Romney   No chief complaint on file.   HPI:   Andrew Parks is a 49 y.o. male presenting today at the request of Jani Gravel, MD for consult colonoscopy.  Upon chart review, noted mild anemia with hemoglobin 12.5 in February 2020. Also with elevated LFTs at that time with AST 47 and ALT 106. Alk phos and total bilirubin normal. Patient was admitted at that time for 1 day due to acute encephalopathy, drug overdose, and agitation.  He had also been seen in the ED a few times prior to this for alcohol abuse, insomnia, chronic anxiety.   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   Today:   Past Medical History:  Diagnosis Date  . Anxiety   . Depression   . Insomnia   . PTSD (post-traumatic stress disorder)   . RLS (restless legs syndrome)   . Seizures ([place])   . Sleep apnea   . Stomach ulcer     Past Surgical History:  Procedure Laterality Date  . ABDOMINAL SURGERY    . GANGLION CYST EXCISION    . KNEE SURGERY      No current outpatient medications on file.   No current facility-administered medications for this visit.    Allergies as of 08/10/2019  . (No Known Allergies)    Family History  Problem Relation Age of Onset  . Hyperlipidemia Mother   . Hypertension Mother   . Depression Mother   . Anxiety disorder Mother     Social History   Socioeconomic History  . Marital status: Single    Spouse name: Not on file  . Number of children: Not on file  . Years of education: Not on file  . Highest education level: Not on file  Occupational History  . Not on file  Tobacco Use  . Smoking status: Former Smoker    Packs/day: 1.00    Years: 1.00    Pack years: 1.00    Types: Cigarettes    Quit date: 08/08/2018    Years since quitting: 1.0  . Smokeless tobacco: Never Used  Substance and Sexual  Activity  . Alcohol use: Yes    Comment: 4-6 beers nightly  . Drug use: Not Currently    Types: Marijuana  . Sexual activity: Yes  Other Topics Concern  . Not on file  Social History Narrative  . Not on file   Social Determinants of Health   Financial Resource Strain:   . Difficulty of Paying Living Expenses: Not on file  Food Insecurity:   . Worried About Charity fundraiser in the Last Year: Not on file  . Ran Out of Food in the Last Year: Not on file  Transportation Needs:   . Lack of Transportation (Medical): Not on file  . Lack of Transportation (Non-Medical): Not on file  Physical Activity:   . Days of Exercise per Week: Not on file  . Minutes of Exercise per Session: Not on file  Stress:   . Feeling of Stress : Not on file  Social Connections:   . Frequency of Communication with Friends and Family: Not on file  . Frequency of Social Gatherings with Friends and Family: Not on file  . Attends Religious Services: Not on file  . Active Member of Clubs or Organizations: Not on file  .  Attends Archivist Meetings: Not on file  . Marital Status: Not on file  Intimate Partner Violence:   . Fear of Current or Ex-Partner: Not on file  . Emotionally Abused: Not on file  . Physically Abused: Not on file  . Sexually Abused: Not on file    Review of Systems: Gen: Denies any fever, chills, fatigue, weight loss, lack of appetite.  CV: Denies chest pain, heart palpitations, peripheral edema, syncope.  Resp: Denies shortness of breath at rest or with exertion. Denies wheezing or cough.  GI: Denies dysphagia or odynophagia. Denies jaundice, hematemesis, fecal incontinence. GU : Denies urinary burning, urinary frequency, urinary hesitancy MS: Denies joint pain, muscle weakness, cramps, or limitation of movement.  Derm: Denies rash, itching, dry skin Psych: Denies depression, anxiety, memory loss, and confusion Heme: Denies bruising, bleeding, and enlarged lymph  nodes.  Physical Exam: There were no vitals taken for this visit. General:   Alert and oriented. Pleasant and cooperative. Well-nourished and well-developed.  Head:  Normocephalic and atraumatic. Eyes:  Without icterus, sclera clear and conjunctiva pink.  Ears:  Normal auditory acuity. Nose:  No deformity, discharge,  or lesions. Mouth:  No deformity or lesions, oral mucosa pink.  Neck:  Supple, without mass or thyromegaly. Lungs:  Clear to auscultation bilaterally. No wheezes, rales, or rhonchi. No distress.  Heart:  S1, S2 present without murmurs appreciated.  Abdomen:  +BS, soft, non-tender and non-distended. No HSM noted. No guarding or rebound. No masses appreciated.  Rectal:  Deferred  Msk:  Symmetrical without gross deformities. Normal posture. Pulses:  Normal pulses noted. Extremities:  Without clubbing or edema. Neurologic:  Alert and  oriented x4;  grossly normal neurologically. Skin:  Intact without significant lesions or rashes. Cervical Nodes:  No significant cervical adenopathy. Psych:  Alert and cooperative. Normal mood and affect.

## 2019-08-09 ENCOUNTER — Ambulatory Visit
Admission: EM | Admit: 2019-08-09 | Discharge: 2019-08-09 | Disposition: A | Discharge status: Home / Self Care | Payer: Medicare Other

## 2019-08-09 ENCOUNTER — Other Ambulatory Visit: Payer: Self-pay

## 2019-08-09 DIAGNOSIS — G4709 Other insomnia: Secondary | ICD-10-CM

## 2019-08-09 MED ORDER — HYDROXYZINE HCL 50 MG PO TABS: 50.0000 mg | ORAL_TABLET | Freq: Every day | ORAL | 0 refills | Status: DC

## 2019-08-09 NOTE — ED Triage Notes (Addendum)
Pt presents to UC w/ c/o insomnia for 4 days. Pt has hx of insomnia, states he used to use cannabis which used to help. Pt needs refill on ropinerol for restless leg syndrome which has kept him up at night.

## 2019-08-09 NOTE — Discharge Instructions (Addendum)
Advised patient to follow-up with primary care for further neurology referral Take medication as prescribed Good sleep hygiene Manage stress Return to ED for worsening of symptoms

## 2019-08-09 NOTE — ED Provider Notes (Signed)
[facility]    CSN: [id number] Arrival date & time: 08/09/19  1145      History   Chief Complaint Chief Complaint  Patient presents with  . Insomnia    HPI Andrew Parks is a 49 y.o. male.   Andrew Parks 49 years old male presented to the urgent care for complaint of insomnia for the past 4 days.  Report he has history of insomnia and was treated with Requip with no relief.  Report he has visited  neurology and ENT for sleep study with no improvement.  Symptoms are made worse every day.  Reports similar symptoms in the past that resolved with Ativan.  Denies fever, chills, appetite change, weight change, chest pain, nausea, vomiting, changes in bowel or bladder habits, confusion, suicidal thoughts.     Past Medical History:  Diagnosis Date  . Anxiety   . Depression   . Insomnia   . PTSD (post-traumatic stress disorder)   . RLS (restless legs syndrome)   . Seizures (HCC)   . Sleep apnea   . Stomach ulcer     Patient Active Problem List   Diagnosis Date Noted  . Acute metabolic encephalopathy 09/04/2018  . Alcohol abuse 09/04/2018  . Transaminitis 09/04/2018  . Agitation   . Drug overdose, intentional self-harm, initial encounter (HCC)   . Polysubstance abuse (HCC) 08/16/2018  . RLS (restless legs syndrome) 08/16/2018  . PTSD (post-traumatic stress disorder) 08/16/2018  . Insomnia 08/16/2018  . Alcohol withdrawal (HCC) 08/14/2018    Past Surgical History:  Procedure Laterality Date  . ABDOMINAL SURGERY    . GANGLION CYST EXCISION    . KNEE SURGERY         Home Medications    Prior to Admission medications   Medication Sig Start Date End Date Taking? Authorizing Provider  rOPINIRole (REQUIP) 4 MG tablet Take 4 mg by mouth at bedtime.   Yes [provider]  hydrOXYzine (ATARAX/VISTARIL) 50 MG tablet Take 1 tablet (50 mg total) by mouth at bedtime. 08/09/19   AvegnoZachery Dakins, FNP    Family History Family History  Problem Relation Age  of Onset  . Hyperlipidemia Mother   . Hypertension Mother   . Depression Mother   . Anxiety disorder Mother   . Healthy Father     Social History Social History   Tobacco Use  . Smoking status: Former Smoker    Packs/day: 1.00    Years: 1.00    Pack years: 1.00    Types: Cigarettes    Quit date: 08/08/2018    Years since quitting: 1.0  . Smokeless tobacco: Never Used  Substance Use Topics  . Alcohol use: Not Currently  . Drug use: Not Currently    Types: Marijuana     Allergies   Patient has no known allergies.   Review of Systems Review of Systems  Constitutional: Negative.   Respiratory: Negative.   Cardiovascular: Negative.   Neurological: Negative.   Psychiatric/Behavioral: Negative for behavioral problems, hallucinations and suicidal ideas. The patient is not hyperactive.   All other systems reviewed and are negative.    Physical Exam Triage Vital Signs ED Triage Vitals [08/09/19 1213]  Enc Vitals Group     BP      Pulse      Resp      Temp      Temp src      SpO2      Weight      Height  Head Circumference      Peak Flow      Pain Score 0     Pain Loc      Pain Edu?      Excl. in [place]?    No data found.  Updated Vital Signs There were no vitals taken for this visit.  Visual Acuity Right Eye Distance:   Left Eye Distance:   Bilateral Distance:    Right Eye Near:   Left Eye Near:    Bilateral Near:     Physical Exam Vitals and nursing note reviewed.  Constitutional:      General: He is not in acute distress.    Appearance: He is normal weight. He is not ill-appearing or toxic-appearing.  Cardiovascular:     Rate and Rhythm: Normal rate and regular rhythm.     Pulses: Normal pulses.     Heart sounds: Normal heart sounds. No murmur.  Pulmonary:     Effort: Pulmonary effort is normal. No respiratory distress.     Breath sounds: Normal breath sounds. No wheezing.  Chest:     Chest wall: No tenderness.  Neurological:      General: No focal deficit present.     Mental Status: He is alert and oriented to person, place, and time.     Cranial Nerves: Cranial nerves are intact.     Motor: Motor function is intact.     Coordination: Coordination is intact.  Psychiatric:        Mood and Affect: Mood normal.        Behavior: Behavior normal.        Thought Content: Thought content normal.        Judgment: Judgment normal.      UC Treatments / Results  Labs (all labs ordered are listed, but only abnormal results are displayed) Labs Reviewed - No data to display  EKG   Radiology No results found.  Procedures Procedures (including critical care time)  Medications Ordered in UC Medications - No data to display  Initial Impression / Assessment and Plan / UC Course  I have reviewed the triage vital signs and the nursing notes.  Pertinent labs & imaging results that were available during my care of the patient were reviewed by me and considered in my medical decision making (see chart for details).    Hydroxyzine will be prescribed Advised patient to follow with primary care To go to ED for worsening of symptoms  Final Clinical Impressions(s) / UC Diagnoses   Final diagnoses:  Other insomnia     Discharge Instructions     Advised patient to follow-up with primary care for further neurology referral Take medication as prescribed Good sleep hygiene Manage stress Return to ED for worsening of symptoms    ED Prescriptions    Medication Sig Dispense Auth. Provider   hydrOXYzine (ATARAX/VISTARIL) 50 MG tablet Take 1 tablet (50 mg total) by mouth at bedtime. 30 tablet Zeffie Bickert, Darrelyn Hillock, FNP     I have reviewed the PDMP during this encounter.   Emerson Monte, FNP 08/09/19 1258

## 2019-08-10 ENCOUNTER — Ambulatory Visit: Payer: Medicare Other | Admitting: Gastroenterology

## 2019-10-07 ENCOUNTER — Other Ambulatory Visit: Payer: Self-pay

## 2019-10-07 ENCOUNTER — Encounter (HOSPITAL_COMMUNITY): Payer: Self-pay | Admitting: *Deleted

## 2019-10-07 ENCOUNTER — Emergency Department (HOSPITAL_COMMUNITY): Payer: Medicare Other

## 2019-10-07 ENCOUNTER — Ambulatory Visit
Admission: EM | Admit: 2019-10-07 | Discharge: 2019-10-07 | Disposition: A | Discharge status: Home / Self Care | Payer: Medicare Other | Source: Home / Self Care

## 2019-10-07 ENCOUNTER — Emergency Department (HOSPITAL_COMMUNITY)
Admission: EM | Admit: 2019-10-07 | Discharge: 2019-10-07 | Disposition: A | Discharge status: Home / Self Care | Payer: Medicare Other | Attending: Emergency Medicine | Admitting: Emergency Medicine

## 2019-10-07 DIAGNOSIS — R1033 Periumbilical pain: Secondary | ICD-10-CM | POA: Diagnosis not present

## 2019-10-07 DIAGNOSIS — Z87891 Personal history of nicotine dependence: Secondary | ICD-10-CM | POA: Diagnosis not present

## 2019-10-07 DIAGNOSIS — R109 Unspecified abdominal pain: Secondary | ICD-10-CM | POA: Diagnosis present

## 2019-10-07 DIAGNOSIS — Z79899 Other long term (current) drug therapy: Secondary | ICD-10-CM | POA: Insufficient documentation

## 2019-10-07 DIAGNOSIS — K862 Cyst of pancreas: Secondary | ICD-10-CM | POA: Diagnosis not present

## 2019-10-07 LAB — COMPREHENSIVE METABOLIC PANEL
ALT: 85 U/L — ABNORMAL HIGH (ref 0–44)
AST: 29 U/L (ref 15–41)
Albumin: 4.4 g/dL (ref 3.5–5.0)
Alkaline Phosphatase: 100 U/L (ref 38–126)
Anion gap: 10 (ref 5–15)
BUN: 13 mg/dL (ref 6–20)
CO2: 23 mmol/L (ref 22–32)
Calcium: 8.8 mg/dL — ABNORMAL LOW (ref 8.9–10.3)
Chloride: 102 mmol/L (ref 98–111)
Creatinine, Ser: 0.68 mg/dL (ref 0.61–1.24)
GFR calc Af Amer: >60 mL/min (ref >60)
GFR calc non Af Amer: >60 mL/min (ref >60)
Glucose, Bld: 86 mg/dL (ref 70–99)
Potassium: 4.3 mmol/L (ref 3.5–5.1)
Sodium: 135 mmol/L (ref 135–145)
Total Bilirubin: 0.8 mg/dL (ref 0.3–1.2)
Total Protein: 8.2 g/dL — ABNORMAL HIGH (ref 6.5–8.1)

## 2019-10-07 LAB — URINALYSIS, ROUTINE W REFLEX MICROSCOPIC
Bilirubin Urine: NEGATIVE
Glucose, UA: NEGATIVE mg/dL
Hgb urine dipstick: NEGATIVE
Ketones, ur: NEGATIVE mg/dL
Leukocytes,Ua: NEGATIVE
Nitrite: NEGATIVE
Protein, ur: NEGATIVE mg/dL
Specific Gravity, Urine: 1.019 (ref 1.005–1.030)
pH: 7 (ref 5.0–8.0)

## 2019-10-07 LAB — CBC WITH DIFFERENTIAL/PLATELET
Abs Immature Granulocytes: 0.04 10*3/uL (ref 0.00–0.07)
Basophils Absolute: 0.1 10*3/uL (ref 0.0–0.1)
Basophils Relative: 1 %
Eosinophils Absolute: 0.2 10*3/uL (ref 0.0–0.5)
Eosinophils Relative: 2 %
HCT: 41.4 % (ref 39.0–52.0)
Hemoglobin: 13.5 g/dL (ref 13.0–17.0)
Immature Granulocytes: 1 %
Lymphocytes Relative: 15 %
Lymphs Abs: 1.3 10*3/uL (ref 0.7–4.0)
MCH: 31.2 pg (ref 26.0–34.0)
MCHC: 32.6 g/dL (ref 30.0–36.0)
MCV: 95.6 fL (ref 80.0–100.0)
Monocytes Absolute: 0.6 10*3/uL (ref 0.1–1.0)
Monocytes Relative: 7 %
Neutro Abs: 6.6 10*3/uL (ref 1.7–7.7)
Neutrophils Relative %: 74 %
Platelets: 289 10*3/uL (ref 150–400)
RBC: 4.33 MIL/uL (ref 4.22–5.81)
RDW: 11.9 % (ref 11.5–15.5)
WBC: 8.8 10*3/uL (ref 4.0–10.5)
nRBC: 0 % (ref 0.0–0.2)

## 2019-10-07 MED ORDER — IOHEXOL 300 MG/ML  SOLN
100.0000 mL | Freq: Once | INTRAMUSCULAR | Status: AC | PRN
  Administered 2019-10-07: 100 mL via INTRAVENOUS

## 2019-10-07 NOTE — ED Triage Notes (Signed)
Pt states last night he started having abdominal pain; pt states he has an umbilical and last night he states it popped out and he has been able to push it back in; pt states the pain is worse certain ways he moves

## 2019-10-07 NOTE — Discharge Instructions (Addendum)
It was a pleasure meeting you.  Please do not hesitate to come back to the emergency department with any new or worsening symptoms.  Please be sure to schedule an appointment with your GI doctor to discuss this visit.

## 2019-10-07 NOTE — ED Provider Notes (Signed)
This patient is a 49 year old male, very soft abdomen on exam without tenderness or evidence of related or incarcerated hernia.  He has been complaining of some abdominal pain since last night left of umbilicus and just lower.  He felt a mass that he could self reduce but is no longer there.  Pain is still present but is much more mild  Anticipate discharge if CT negative  Medical screening examination/treatment/procedure(s) were conducted as a shared visit with non-physician practitioner(s) and myself.  I personally evaluated the patient during the encounter.  Clinical Impression:   Final diagnoses:  Pancreatic cyst  Periumbilical abdominal pain         Eber Hong, MD 10/08/19 2249

## 2019-10-07 NOTE — ED Provider Notes (Signed)
[facility] EMERGENCY DEPARTMENT Provider Note   CSN: [id number] Arrival date & time: 10/07/19  1212     History Chief Complaint  Patient presents with  . Abdominal Pain    Andrew Parks is a 49 y.o. male.  HPI HPI Comments: Andrew Parks is a 49 y.o. male who presents to the Emergency Department complaining of centralized abdominal pain that started last night. Pt has a history of umbilical hernia and states that while walking his dog he experienced sudden onset pain in the region of his hernia and felt a "bulge". His pain was initially 10/10 and after reducing the hernia it gradually alleviated and is now a 5/10. He took 1x APAP and 1x ibuprofen last night with mild relief of his pain. He additionally experienced nausea and 3x emesis but states "it was dark out and I'm not sure what it looked like". Pt notes that 1.5 weeks ago he experienced 2x BRB when having BMs but denies any occurences since and denies he experienced any abdominal pain at this time. He has been having daily normal BMs with his most recent being this morning. He takes ropinirole for RLS. He has a history of insomnia and states he takes Kratom for this as well as smokes marijuana infrequently. He denies any current n/v. He denies fevers, chills, CP, SOB, urinary sx, URI sx, diarrhea, constipation.     Past Medical History:  Diagnosis Date  . Anxiety   . Depression   . Insomnia   . PTSD (post-traumatic stress disorder)   . RLS (restless legs syndrome)   . Seizures (HCC)   . Sleep apnea   . Stomach ulcer     Patient Active Problem List   Diagnosis Date Noted  . Acute metabolic encephalopathy 09/04/2018  . Alcohol abuse 09/04/2018  . Transaminitis 09/04/2018  . Agitation   . Drug overdose, intentional self-harm, initial encounter (HCC)   . Polysubstance abuse (HCC) 08/16/2018  . RLS (restless legs syndrome) 08/16/2018  . PTSD (post-traumatic stress disorder) 08/16/2018  . Insomnia 08/16/2018  . Alcohol  withdrawal (HCC) 08/14/2018    Past Surgical History:  Procedure Laterality Date  . ABDOMINAL SURGERY    . GANGLION CYST EXCISION    . KNEE SURGERY         Family History  Problem Relation Age of Onset  . Hyperlipidemia Mother   . Hypertension Mother   . Depression Mother   . Anxiety disorder Mother   . Healthy Father     Social History   Tobacco Use  . Smoking status: Former Smoker    Packs/day: 1.00    Years: 1.00    Pack years: 1.00    Types: Cigarettes    Quit date: 08/08/2018    Years since quitting: 1.1  . Smokeless tobacco: Never Used  Substance Use Topics  . Alcohol use: Not Currently  . Drug use: Not Currently    Types: Marijuana    Home Medications Prior to Admission medications   Medication Sig Start Date End Date Taking? Authorizing Provider  hydrOXYzine (ATARAX/VISTARIL) 50 MG tablet Take 1 tablet (50 mg total) by mouth at bedtime. 08/09/19   Avegno, Zachery Dakins, FNP  rOPINIRole (REQUIP) 4 MG tablet Take 4 mg by mouth at bedtime.    [provider]    Allergies    Patient has no known allergies.  Review of Systems   Review of Systems  Constitutional: Negative for activity change, appetite change, chills and fever.  HENT: Negative for  congestion and rhinorrhea.   Respiratory: Negative for shortness of breath.   Cardiovascular: Negative for chest pain and leg swelling.  Gastrointestinal: Positive for abdominal pain, blood in stool, nausea and vomiting. Negative for constipation and diarrhea.  Genitourinary: Negative for difficulty urinating, dysuria and hematuria.  Skin: Negative for color change and rash.  Neurological: Positive for light-headedness. Negative for dizziness.  All other systems reviewed and are negative.  Physical Exam Updated Vital Signs BP 138/87 (BP Location: Right Arm)   Pulse 82   Temp 98.2 F (36.8 C) (Oral)   Resp 18   Ht 5\' 8"  (1.727 m)   Wt 77.1 kg   SpO2 98%   BMI 25.85 kg/m   Physical Exam Vitals and  nursing note reviewed.  Constitutional:      General: He is not in acute distress.    Appearance: He is well-developed and normal weight. He is not ill-appearing, toxic-appearing or diaphoretic.  HENT:     Head: Normocephalic and atraumatic.     Mouth/Throat:     Mouth: Mucous membranes are moist.  Eyes:     General: No scleral icterus.    Extraocular Movements: Extraocular movements intact.     Pupils: Pupils are equal, round, and reactive to light.  Cardiovascular:     Rate and Rhythm: Normal rate and regular rhythm.     Heart sounds: Normal heart sounds. No murmur. No friction rub. No gallop.   Pulmonary:     Effort: Pulmonary effort is normal. No respiratory distress.     Breath sounds: Normal breath sounds. No stridor. No wheezing, rhonchi or rales.  Abdominal:     General: Abdomen is flat. A surgical scar is present. Bowel sounds are normal.     Palpations: Abdomen is soft. There is no mass or pulsatile mass.     Tenderness: There is abdominal tenderness in the periumbilical area. There is no guarding or rebound. Negative signs include Murphy's sign, Rovsing's sign and McBurney's sign.     Hernia: No hernia is present. There is no hernia in the umbilical area.     Comments: Multiple well healed laparoscopic surgical scars noted on the abdomen. Mild TTP noted with deep palpation just left of the umbilicus. No obvious herniation present. No erythema noted. No peritoneal signs. No other signs of tenderness. No organomegaly. Normoactive bowel sounds.   Skin:    General: Skin is warm and dry.     Capillary Refill: Capillary refill takes less than 2 seconds.  Neurological:     General: No focal deficit present.     Mental Status: He is alert and oriented to person, place, and time.  Psychiatric:        Mood and Affect: Mood normal.        Behavior: Behavior normal.    ED Results / Procedures / Treatments   Labs (all labs ordered are listed, but only abnormal results are  displayed) Labs Reviewed  COMPREHENSIVE METABOLIC PANEL - Abnormal; Notable for the following components:      Result Value   Calcium 8.8 (*)    Total Protein 8.2 (*)    ALT 85 (*)    All other components within normal limits  CBC WITH DIFFERENTIAL/PLATELET  URINALYSIS, ROUTINE W REFLEX MICROSCOPIC    EKG None  Radiology CT ABDOMEN PELVIS W CONTRAST  Result Date: 10/07/2019 CLINICAL DATA:  Abdominal pain.  Hematochezia. EXAM: CT ABDOMEN AND PELVIS WITH CONTRAST TECHNIQUE: Multidetector CT imaging of the abdomen and pelvis was  performed using the standard protocol following bolus administration of intravenous contrast. CONTRAST:  OMNIPAQUE IOHEXOL 300 MG/ML  SOLN COMPARISON:  None. FINDINGS: Lower chest: The lung bases are clear. The heart size is normal. Hepatobiliary: The liver is normal. Normal gallbladder.There is no biliary ductal dilation. Pancreas: There is a cystic appearing mass involving the distal pancreatic body/pancreatic tail measuring approximately 5.2 x 4.9 cm. There is no CT evidence for pancreatitis. There may be a few additional smaller cysts in the pancreatic tail. Spleen: No splenic laceration or hematoma. Adrenals/Urinary Tract: --Adrenal glands: No adrenal hemorrhage. --Right kidney/ureter: There are multiple nonobstructing right-sided kidney stones. There is no right-sided hydronephrosis. --Left kidney/ureter: There are multiple left-sided kidney stones without evidence for hydronephrosis. --Urinary bladder: There is some mild diffuse bladder wall thickening. Stomach/Bowel: --Stomach/Duodenum: There are surgical staples along the lesser curvature of the stomach. 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 --Small bowel: No dilatation or inflammation. --Colon: There is scattered colonic diverticula without CT evidence for diverticulitis. --Appendix: Normal. Vascular/Lymphatic: Atherosclerotic calcification is present within the non-aneurysmal abdominal aorta, without  hemodynamically significant stenosis. --No retroperitoneal lymphadenopathy. --No mesenteric lymphadenopathy. --No pelvic or inguinal lymphadenopathy. Reproductive: The prostate gland is enlarged. Other: No ascites or free air. There is a small fat containing umbilical hernia. Musculoskeletal. No acute displaced fractures. IMPRESSION: 1. No acute abnormality. 2. There is a 5.2 cm cystic mass involving the pancreas as detailed above. 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 A 6 month follow-up CT is recommended to confirm stability or resolution. 3. Bilateral nonobstructing nephrolithiasis. 4. Normal appendix. 5. Mild diffuse bladder wall thickening is noted. Correlation with urinalysis is recommended. 6. There is scattered colonic diverticula without CT evidence for diverticulitis. Aortic Atherosclerosis (ICD10-I70.0). Electronically Signed   By: Katherine Mantle M.D.   On: 10/07/2019 18:59    Procedures Procedures (including critical care time)  Medications Ordered in ED Medications  iohexol (OMNIPAQUE) 300 MG/ML solution 100 mL (100 mLs Intravenous Contrast Given 10/07/19 1823)    ED Course  I have reviewed the triage vital signs and the nursing notes.  Pertinent labs & imaging results that were available during my care of the patient were reviewed by me and considered in my medical decision making (see chart for details).    MDM Rules/Calculators/A&P                      4:52 PM Pt is a 49 year old caucasian male that presents with one day of mildly alleviated abdominal pain 2/2 to a periumbilical hernia.  His physical exam is significant for mild TTP just left lateral of the umbilicus. He states he had two episodes of hematochezia 1.5 weeks ago but none since. He has a history of insomnia and polysubstance abuse. He is taking Kratom nightly for his insomnia and smokes marijuana "when he can afford it" but denies he has had any in the past week.  Though his physical exam is  reassuring he does not typically experience abdominal pain and is exhibiting significant distress.  Will obtain basic labs, UA, CT of the abdomen.  Patient is amenable with this plan.  Will monitor and reassess.  7:22 PM labs are reassuring.  No anemia.  No leukocytosis.  CT scan shows no acute abnormality but does show 5.2 cm cystic mass involving the pancreas, bilateral nonobstructing nephrolithiasis, scattered diverticula but no sign of diverticulitis.  Discussed this with patient and his questions were answered.  He verbalized understanding.  He  understands to return to the emergency department with any new or worsening symptoms.  Will provide outpatient GI follow-up.  Vital signs stable and he was amicable at the time of discharge.  Final Clinical Impression(s) / ED Diagnoses Final diagnoses:  Pancreatic cyst  Periumbilical abdominal pain    Rx / DC Orders ED Discharge Orders    None       Rayna Sexton, PA-C 10/07/19 1944    Noemi Chapel, MD 10/08/19 2249

## 2019-10-07 NOTE — ED Triage Notes (Signed)
Pt states that he has an umbilical hernia and that he experienced 10/10 pain last night. Pt states he thinks he was able to reduce it but is still having pain and blood in stool. Provider made aware and pt encouraged to go to ED for higher level of care. Pt agreeable

## 2019-10-08 ENCOUNTER — Encounter: Payer: Self-pay | Admitting: Gastroenterology

## 2019-10-08 NOTE — Progress Notes (Signed)
Referring Provider: [facility] Primary Care Physician:  Pearson Grippe, MD Primary Gastroenterologist:  Dr. Jena Gauss  Chief Complaint  Patient presents with  . Abdominal Pain    seen en ER diagnosed with pancreatic cyst. Has had abd pain off/on for years. Had a pancreatic cyst removed 4-5 years ago    HPI:   Andrew Parks is a 49 y.o. male presenting today at the request of [facility] for abdominal pain.  Previously referred by Pearson Grippe, MD for consult colonoscopy.  He no showed to his office visit.  Patient was seen in the ED on 10/07/2019 for periumbilical abdominal pain and pancreatic cyst.  Patient reported acute onset of pain at the site of umbilical hernia when walking his dog.  After reducing the hernia, gradually alleviated.  Also with 3 episodes of nausea with vomiting.  CBC, CMP, urinalysis completed.  All labs essentially normal other than ALT 85 (H).  CT abdomen and pelvis with significant for 5.2 cm cystic mass involving the pancreas favored to represent a pseudocyst and few additional smaller cyst in pancreatic tail with recommendations to repeat CT in 6 months to confirm stability or resolution, bilateral nonobstructing nephrolithiasis, scattered diverticula without diverticulitis, diffuse bladder wall thickening with recommendations correlate with urinalysis. Suspected pain was secondary to periumbilical hernia.  Recommended GI follow-up.  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  Recommended GI eval with EUS or surgical consult.  EGD/EUS with FNA 12/18/2016 with Dr. 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  Remainder of the pancreas appeared normal.  EGD with normal-appearing esophagus, stomach s/p random antral biopsies, normal duodenum.  Pathology of cyst  aspirate negative for malignant cells.  Gastric antral biopsies benign.   Today: Was standing outside watching his dog and had acute onset of abdominal pain when his hernia buldged out. Pushed his hernia back which caused significant pain. Pain has essentially resolved. About a 2/10 to the left side of his umbilicus at the site of his hernia. Had associated nausea with 3 espisodes of emesis. Unable to see color due to being dark outside. Yesterday had one episode of vomiting associated with short recurrence of pain. No hematemesis. No recent abdominal pain in the last 6 month prior to this episode. Hernias have "popped out" intermittently but always reduce easily. Has seen a surgeon in the past and was told he could have surgery if he wanted to.   2 weeks ago, he had bright red blood in the toilet water, on toilet tissue, and on stool. This occurs every 1-2 months. First occurred about 3 years ago. No associated rectal pain. Occasional constipation when taking kratom. Typically BMs daily. No known hemorrhoids. No diarrhea. No prior colonoscopy.  No known family history of colon cancer but family history is limited.  No GERD symptoms. Rare NSAID use. No black stools. 1996 had PUD. Had to have emergency surgery due to losing 7 pints of blood. Rare dysphagia. Once or twice a year.  Does not feel this needs to be evaluated or addressed.  Pancreatic cyst: No history of pancreatitis. No LUQ pain or upper back pain. Has lower back pain chronically.  No nausea or vomiting except with recent episode of abdominal pain and protruding hernia.  No jaundice. Limited knowledge of family history. No known  family history of pancreatic cancer. No unexplained weight loss.   Elevated LFTs: All tattoos professional. No IV or intranasal drug use. No known hepatitis exposures. No alcohol in 6 months.  Was drinking regularly prior to this.  No Tylenol. Occasional kratom.  Occasional marijuana.  No swelling in his abdomen or  lower extremities, bruising, dark urine, or confusion.  No fever, or chills. Some lightheadedness at times.  Feels this is related to lack of sleep.  Reports significant insomnia.  History of seizures associated with lack of sleep.  Reports he just stares off when he has a seizure.  No pre-syncope or syncope. No chest pain, heart palpitations, shortness of breath, or cough.   Moved from [place] to [place] and fell into a depression.  Started drinking quite a bit. 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 When he moved to [facility], all of that essentially stopped.  He is living here with his fiance who he is very happy with but is not happy with the rest of his life.  He has started working on writing music again and is hopeful that this will pick up as Covid continues to decline.  Past Medical History:  Diagnosis Date  . Alcohol abuse   . Anxiety   . Depression   . Insomnia   . PTSD (post-traumatic stress disorder)   . RLS (restless legs syndrome)   . Seizures (HCC)    per patient, secondary to lack of sleep. No medications. Reports he just stares off.   . Sleep apnea   . Stomach ulcer 1996   Patient reports having emergent abdominal surgery    Past Surgical History:  Procedure Laterality Date  . ABDOMINAL SURGERY  1996   Per patient, this was emergency surgery secondary to peptic ulcer disease with significant bleeding  . ESOPHAGOGASTRODUODENOSCOPY  12/18/2016   [place]; Dr. Lowry Bowl; normal-appearing esophagus, normal stomach s/p random antral biopsies, normal duodenum.  Biopsies were benign.  . EUS  12/18/2016   [place]; Dr. Lowry Bowl; 3.5 cm pancreatic tail cyst with no solid components or nodules s/p aspiration.  Remainder of the pancreas appeared normal.  Cyst aspirate negative for malignant cells.  Marland Kitchen GANGLION CYST EXCISION    . KNEE SURGERY      Current Outpatient Medications  Medication Sig Dispense Refill  . rOPINIRole (REQUIP) 4 MG tablet Take 4 mg  by mouth at bedtime.    . polyethylene glycol-electrolytes (TRILYTE) 420 g solution Take 4,000 mLs by mouth as directed. 4000 mL 0   No current facility-administered medications for this visit.    Allergies as of 10/09/2019  . (No Known Allergies)    Family History  Problem Relation Age of Onset  . Hyperlipidemia Mother   . Hypertension Mother   . Depression Mother   . Anxiety disorder Mother   . Healthy Father   . Pancreatic cancer Neg Hx        family history is limited  . Colon cancer Neg Hx        family history is limited    Social History   Socioeconomic History  . Marital status: Single    Spouse name: Not on file  . Number of children: Not on file  . Years of education: Not on file  . Highest education level: Not on file  Occupational History  . Not on file  Tobacco Use  . Smoking status: Current Every Day Smoker    Packs/day: 1.00    Years:  1.00    Pack years: 1.00    Types: Cigarettes  . Smokeless tobacco: Never Used  Substance and Sexual Activity  . Alcohol use: Not Currently    Comment: 1 year ago was drinking regularly.  No alcohol in the last 6 months.  (10/09/19)  . Drug use: Yes    Types: Marijuana    Comment: once a while  . Sexual activity: Yes  Other Topics Concern  . Not on file  Social History Narrative  . Not on file   Social Determinants of Health   Financial Resource Strain:   . Difficulty of Paying Living Expenses:   Food Insecurity:   . Worried About Charity fundraiser in the Last Year:   . Arboriculturist in the Last Year:   Transportation Needs:   . Film/video editor (Medical):   Marland Kitchen Lack of Transportation (Non-Medical):   Physical Activity:   . Days of Exercise per Week:   . Minutes of Exercise per Session:   Stress:   . Feeling of Stress :   Social Connections:   . Frequency of Communication with Friends and Family:   . Frequency of Social Gatherings with Friends and Family:   . Attends Religious Services:   .  Active Member of Clubs or Organizations:   . Attends Archivist Meetings:   Marland Kitchen Marital Status:   Intimate Partner Violence:   . Fear of Current or Ex-Partner:   . Emotionally Abused:   Marland Kitchen Physically Abused:   . Sexually Abused:     Review of Systems: Gen: See HPI CV: See HPI Resp: See HPI GI: See HPI GU : Denies urinary burning, urinary frequency, urinary hesitancy MS: Chronic back pain. No other joint pain.   Derm: Denies rash Psych: See HPI  Heme: See HPI  Physical Exam: BP 126/78   Pulse 75   Temp 97.6 F (36.4 C) (Oral)   Ht 5\' 8"  (1.727 m)   Wt 187 lb 12.8 oz (85.2 kg)   BMI 28.55 kg/m  General: Alert and oriented. Pleasant and cooperative. Well-nourished and well-developed.  Head: Normocephalic and atraumatic. Eyes:  Without icterus, sclera clear and conjunctiva pink.  Ears:  Normal auditory acuity. Lungs:  Clear to auscultation bilaterally. No wheezes, rales, or rhonchi. No distress.  Heart:  S1, S2 present without murmurs appreciated.  Abdomen:  +BS, soft, non-tender and non-distended. No HSM noted. No guarding or rebound.  No significant abdominal wall defect or hernia appreciated. Rectal:  Deferred  Msk:  Symmetrical without gross deformities. Normal posture. Extremities:  Without edema. Neurologic:  Alert and  oriented x4;  grossly normal neurologically. Skin:  Intact without significant lesions or rashes.  Multiple tattoos. Psych:  Normal mood and affect.

## 2019-10-09 ENCOUNTER — Encounter: Payer: Self-pay | Admitting: Gastroenterology

## 2019-10-09 ENCOUNTER — Ambulatory Visit (INDEPENDENT_AMBULATORY_CARE_PROVIDER_SITE_OTHER): Payer: Medicare Other | Admitting: Gastroenterology

## 2019-10-09 ENCOUNTER — Other Ambulatory Visit: Payer: Self-pay

## 2019-10-09 ENCOUNTER — Telehealth: Payer: Self-pay

## 2019-10-09 DIAGNOSIS — R7989 Other specified abnormal findings of blood chemistry: Secondary | ICD-10-CM

## 2019-10-09 DIAGNOSIS — K429 Umbilical hernia without obstruction or gangrene: Secondary | ICD-10-CM | POA: Diagnosis not present

## 2019-10-09 DIAGNOSIS — K625 Hemorrhage of anus and rectum: Secondary | ICD-10-CM | POA: Insufficient documentation

## 2019-10-09 DIAGNOSIS — K862 Cyst of pancreas: Secondary | ICD-10-CM | POA: Insufficient documentation

## 2019-10-09 MED ORDER — PEG 3350-KCL-NA BICARB-NACL 420 G PO SOLR: 4000.0000 mL | ORAL | 0 refills | Status: DC

## 2019-10-09 NOTE — Telephone Encounter (Signed)
Noted. Pt is aware 

## 2019-10-09 NOTE — Assessment & Plan Note (Addendum)
Recent CT A/P on 10/07/2019 with cystic appearing mass involving the distal pancreatic body/pancreatic tail measuring approximately 5.2 x 4.9 cm. No CT evidence for pancreatitis. There may be a few additional smaller cysts in the pancreatic tail.  Interestingly, CT abdomen and pelvis in May 2018 with 3.7 cm cystic lesion.  EUS with FNA on 12/18/2016 with Dr. Lowry Bowl revealed 3.5 cm pancreatic tail cyst with no solid components or nodules s/p aspiration, remainder of the pancreas appeared normal.  Aspirate negative for malignant cells.  Patient denies history of pancreatitis.  No pancreatic type pain, jaundice, or unexplained weight loss.  Family history is limited but no known family history of pancreatic cancer.  It is unclear whether or not this cyst is the same cyst that was present in 2018.  Radiologist suspected pseudocyst with recommendations to repeat CT in 6 months.  However, with no history of pancreatitis, increasing cyst size, and mention of other smaller cyst in the pancreatic tail on most recent CT which was not seen on prior EUS or CT, this needs further evaluation with EUS and possible FNA.  Will refer to Dr. Christella Hartigan at [facility].

## 2019-10-09 NOTE — Telephone Encounter (Signed)
Pt was seen this morning and asked to d/c otc supplements. Pt is asking if a low dose Lorazepam can be called in because pt states he can't sleep without taking otc sleeping aid. Please advise.

## 2019-10-09 NOTE — Assessment & Plan Note (Addendum)
49 year old male reporting 3-year history of intermittent rectal bleeding with blood in the toilet water, on stool, and on toilet tissue.  Last episode about 2 weeks ago.  No associated rectal pain or known hemorrhoids.  Typically BMs daily.  No unintentional weight loss.  Intermittent abdominal pain associated with abdominal hernias that protrude at times. No prior colonoscopy.  Family history is limited but no known family history of colon cancer.  Most recent hemoglobin 13.5 on 10/07/2019.  Differentials include benign anorectal source such as hemorrhoids versus colon polyps versus malignancy.  Proceed with TCS with propofol with Dr. Jena Gauss in the near future. The risks, benefits, and alternatives have been discussed in detail with patient. They have stated understanding and desire to proceed.  Propofol due to history of alcohol abuse (no alcohol currently), marijuana use, significant insomnia, ?  Seizures not on medications. Follow-up after colonoscopy.

## 2019-10-09 NOTE — Patient Instructions (Addendum)
Please have labs completed.  We will get you scheduled for colonoscopy in the near future with Dr. Jena Gauss to evaluate your rectal bleeding.  We will send a referral to [facility] for further evaluation of your pancreatic cyst.  We will send referral to general surgery for further management of your hernia.  Regarding your elevated liver function test: We are completing labs to evaluate this further.  I suspect this may been secondary to history of alcohol use. Discontinue over-the-counter supplements. Avoid Tylenol. Avoid alcohol. Continue avoiding marijuana for now. We will call you when your labs result.  We will plan to follow-up with you in the office after your colonoscopy.  Should your hernia protrude out again and is nonreducible or associated with significant abdominal pain, you should proceed back to the emergency room.  If you have significant rectal bleeding, feel lightheaded or like he may pass out, you should proceed to the emergency room.  Ermalinda Memos, PA-C [facility]

## 2019-10-09 NOTE — Assessment & Plan Note (Addendum)
Recent labs on 10/07/2019 with ALT 85 (H), AST, alk phos, total bilirubin within normal limits.  AST slightly elevated starting in January 2020.  AST and ALT both elevated in February 2020 at 47/26 respectively and have been trending down slowly since then.  Platelets normal in March 2021. Recent CT A/P with contrast with normal-appearing liver.  No signs or symptoms of advanced liver disease at this time.  History of regular alcohol use in the past. Denies any alcohol for the last 6 months.  Denies history of IV or intranasal drug use.  He does use marijuana intermittently.  Takes kratom at night to help with sleep.  No Tylenol. No other OTC supplements.  Has multiple multiple tattoos but reports they are all done professionally.   Mild elevation of LFTs may be secondary to history of alcohol use, marijuana use, and kratom.  No significant risk factors for NAFLD.  We will go ahead and check him for hepatitis B and C as well as hemochromatosis.   Labs: Hepatitis B surface antigen, hepatitis B surface antibody, hepatitis B core antibody total, hepatitis C antibody, iron panel with ferritin. Discontinue kratom and marijuana use. Continue to avoid alcohol. Avoid Tylenol. Avoid OTC supplements. Follow-up after colonoscopy for rectal bleeding as discussed above.

## 2019-10-09 NOTE — Telephone Encounter (Signed)
He will need to discuss that with his PCP.

## 2019-10-09 NOTE — Assessment & Plan Note (Signed)
Patient has history of umbilical hernia.  Recently presented to the ED on 10/07/2019 for acute onset of periumbilical abdominal pain that occurred when his hernia protruded.  He reports significant pain upon reducing the hernia himself.  Associated nausea with vomiting.  CT A/P performed when seen in the ED revealed small fat containing umbilical hernia. At this time, symptoms have essentially resolved.  Very mild intermittent abdominal pain just to the left of his umbilicus. Reports seeing a surgeon a few years back when he lived in [place] and was told to let them know when he wanted to have surgery.   We will go ahead and refer him to general surgery for further management.  He was advised if his hernia protruded and would not reduce or if he had recurrence of significant abdominal pain that he should proceed to the emergency room.

## 2019-10-12 ENCOUNTER — Telehealth: Payer: Self-pay

## 2019-10-12 ENCOUNTER — Telehealth: Payer: Self-pay | Admitting: Internal Medicine

## 2019-10-12 ENCOUNTER — Other Ambulatory Visit: Payer: Self-pay

## 2019-10-12 DIAGNOSIS — K429 Umbilical hernia without obstruction or gangrene: Secondary | ICD-10-CM

## 2019-10-12 LAB — HEPATITIS B SURFACE ANTIBODY,QUALITATIVE: Hep B S Ab: NONREACTIVE

## 2019-10-12 LAB — IRON,TIBC AND FERRITIN PANEL
%SAT: 37 % (calc) (ref 20–48)
Ferritin: 215 ng/mL (ref 38–380)
Iron: 138 ug/dL (ref 50–180)
TIBC: 375 mcg/dL (calc) (ref 250–425)

## 2019-10-12 LAB — HEPATITIS C ANTIBODY
Hepatitis C Ab: NONREACTIVE
SIGNAL TO CUT-OFF: 0.05 (ref <1.00)

## 2019-10-12 LAB — HEPATITIS B CORE ANTIBODY, TOTAL: Hep B Core Total Ab: NONREACTIVE

## 2019-10-12 LAB — HEPATITIS B SURFACE ANTIGEN: Hepatitis B Surface Ag: NONREACTIVE

## 2019-10-12 NOTE — Telephone Encounter (Signed)
Where is pain located? He has an umbilical hernia and previously it was felt pain was secondary to this. Does he have any abdominal distension, bulging at umbilicus, nausea, vomiting? If so, needs to go to ED. I would be concerned about hernia causing pain. 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 We need more information. As for tylenol going forward, can take this but would limit to no more than 2 grams in a 24 hour time period and only take as needed (example headaches, fever, etc). With the pain he is having now, he needs further assessment.

## 2019-10-12 NOTE — Telephone Encounter (Signed)
Pt returning call. [phone]

## 2019-10-12 NOTE — Telephone Encounter (Signed)
Pt has an apt with Dr. Christella Hartigan. Our office referred pt. Pt is having a lot of pain and was asked not to take Tylenol or any pain medications. Pt has also had trouble sleeping. Pt was advised to call his PCP on last week to ask about pain medications per [facility]. Pt did call and according to pts significant, pt isn't able to take the medicine prescribed. Do you recommend anything otc for the pain pt has been in for the pancreatic cyst?

## 2019-10-12 NOTE — Telephone Encounter (Signed)
Rachael Fee, MD  Loretha Stapler, RN  This is actually for an EUS.   I think it's best to see him in the office first given h/o panc cyst in the same location evaluated elsewhere by EUS. Please offer him OV with myself or Dr. Meridee Score, NGI first available for pancreatic cyst. Thanks   DJ   Pt scheduled to see Dr. Christella Hartigan 10/13/19@10 :10am. Pt aware of appt.

## 2019-10-12 NOTE — Telephone Encounter (Signed)
Please call patient, his fiance stated he is in severe pain and they have a question about tylenola

## 2019-10-12 NOTE — Telephone Encounter (Signed)
FYI Spoke with pt. Pts pain is an inch away from his navel. Pt felt his abdomen was a little distended the other day. Pt reports no n/v, no lightheadedness. Pt is aware that he can take no more than 2 gms of Tylenol within a 24 hour period for headaches, fever ect. Pt asked several times if our office could prescribe sleeping medication. Pt was advised on Friday to discuss with his PCP per [facility]. Pt is aware that if his symptoms worsen, he should report to the ED for evaluation.

## 2019-10-12 NOTE — Telephone Encounter (Signed)
Noted. See other telephone note. This was addressed by Lewie Loron.

## 2019-10-12 NOTE — Telephone Encounter (Signed)
Tried to call pt, call went straight to VM, LMOVM for return call. 

## 2019-10-12 NOTE — Telephone Encounter (Signed)
Spoke to pt, he requested pain medication and medication to help him sleep. Pain is at belly button. Informed pt our office doesn't usually prescribe pain medications. Asked him if he had contacted PCP, he said yes. PCP told him everything was addictive. Informed him TCS is for rectal bleeding and not for hernia. Surgery referral was sent today for hernia. He is aware K[facility] is off today but I will send message to her.

## 2019-10-12 NOTE — Telephone Encounter (Signed)
Received VM this morning from pt's fiance Darl Pikes, listed on DPR) requesting to move up TCS d/t pt having pain. Also requesting for him to be given pain medication.  Tried to call Darl Pikes back, no answer, LMOVM for return call.

## 2019-10-12 NOTE — Telephone Encounter (Signed)
Darl Pikes. To ED if worsening pain.

## 2019-10-12 NOTE — Telephone Encounter (Signed)
Noted. Agree with recommendations.  

## 2019-10-13 ENCOUNTER — Other Ambulatory Visit: Payer: Self-pay

## 2019-10-13 ENCOUNTER — Ambulatory Visit (INDEPENDENT_AMBULATORY_CARE_PROVIDER_SITE_OTHER): Payer: Medicare Other | Admitting: Gastroenterology

## 2019-10-13 ENCOUNTER — Encounter: Payer: Self-pay | Admitting: Gastroenterology

## 2019-10-13 VITALS — BP 128/76 | HR 72 | Temp 98.7°F | Ht 68.0 in | Wt 187.0 lb

## 2019-10-13 DIAGNOSIS — K862 Cyst of pancreas: Secondary | ICD-10-CM | POA: Diagnosis not present

## 2019-10-13 NOTE — Patient Instructions (Addendum)
If you are age 50 or older, your body mass index should be between 23-30. Your Body mass index is 28.43 kg/m. If this is out of the aforementioned range listed, please consider follow up with your Primary Care Provider.  If you are age 70 or younger, your body mass index should be between 19-25. Your Body mass index is 28.43 kg/m. If this is out of the aformentioned range listed, please consider follow up with your Primary Care Provider.   You have been scheduled for an upper EUS. Please follow written instructions given to you at your visit today. If you use inhalers (even only as needed), please bring them with you on the day of your procedure.  Thank you for entrusting me with your care and choosing [facility].  Dr Christella Hartigan

## 2019-10-13 NOTE — Progress Notes (Signed)
HPI: This is a very pleasant 49 year old man who was referred by [facility] gastroenterology Dr. Jena Gauss for pancreatic cyst  Sounds like he was having intermittent abdominal pains 3 to 4 years ago while living in [place] when the pancreatic cyst was discovered initially. He underwent endoscopic ultrasound, see those results I will summarize below.  He has never been told he had acute pancreatitis. Lately he has been abusing alcohol but not previously. His weight is overall stable. Pancreatic cancer does not run in his family.  He has a variety of abdominal pains right lower quadrant, right upper quadrant, epigastric, left mid quadrant. These are intermittent.  He was seen by gastroenterologist in [place], they are referring him to general surgery to discuss his umbilical hernia, consider repair.  They were working up his elevated liver tests.  They were arranging for a colonoscopy in the near future for intermittent rectal bleeding.  They sent him here for evaluation of his pancreatic cyst.  Old Data Reviewed: IMPRESSION: 1. No acute abnormality. 2. There is a 5.2 cm cystic mass involving the pancreas as detailed above. 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 A 6 month follow-up CT is recommended to confirm stability or resolution. 3. Bilateral nonobstructing nephrolithiasis. 4. Normal appendix. 5. Mild diffuse bladder wall thickening is noted. Correlation with urinalysis is recommended. 6. There is scattered colonic diverticula without CT evidence for Diverticulitis.  Pancreas: There is a cystic appearing mass involving the distal pancreatic body/pancreatic tail measuring approximately 5.2 x 4.9 cm. There is no CT evidence for pancreatitis. There may be a few  additional smaller cysts in the pancreatic tail.    Blood work March 2017 normal CBC, complete metabolic profile is normal except for ALT 85.  A battery of other liver testing was recently added  on.   [facility] everywehere review: GD/EUS with FNA 12/18/2016 with Dr. 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  Remainder of the pancreas appeared normal.  EGD with normal-appearing esophagus, stomach s/p random antral biopsies, normal duodenum.  Pathology of cyst aspirate negative for malignant cells.  Gastric antral biopsies benign. Lipase level was 4747, CEA level was 0.2.  CA 19-9 serum around that time was 12    Review of systems: Pertinent positive and negative review of systems were noted in the above HPI section. All other review negative.   Past Medical History:  Diagnosis Date  . Alcohol abuse   . Anxiety   . Depression   . Insomnia   . PTSD (post-traumatic stress disorder)   . RLS (restless legs syndrome)   . Seizures (HCC)    per patient, secondary to lack of sleep. No medications. Reports he just stares off.   . Sleep apnea   . Stomach ulcer 1996   Patient reports having emergent abdominal surgery    Past Surgical History:  Procedure Laterality Date  . ABDOMINAL SURGERY  1996   Per patient, this was emergency surgery secondary to peptic ulcer disease with significant bleeding  . ESOPHAGOGASTRODUODENOSCOPY  12/18/2016   [place]; Dr. Lowry Bowl; normal-appearing esophagus, normal stomach s/p random antral biopsies, normal duodenum.  Biopsies were benign.  . EUS  12/18/2016   [place]; Dr. Lowry Bowl; 3.5 cm pancreatic tail cyst with no solid components or nodules s/p aspiration.  Remainder of the pancreas appeared normal.  Cyst aspirate negative for malignant cells.  Marland Kitchen GANGLION CYST EXCISION    . KNEE SURGERY      Current  Outpatient Medications  Medication Sig Dispense Refill  . rOPINIRole (REQUIP) 4 MG tablet Take 4 mg by mouth at bedtime.     No current facility-administered medications for this visit.    Allergies as of 10/13/2019  . (No Known Allergies)    Family History  Problem Relation Age of Onset  .  Hyperlipidemia Mother   . Hypertension Mother   . Depression Mother   . Anxiety disorder Mother   . Healthy Father   . Pancreatic cancer Neg Hx        family history is limited  . Colon cancer Neg Hx        family history is limited    Social History   Socioeconomic History  . Marital status: Single    Spouse name: Not on file  . Number of children: 2  . Years of education: Not on file  . Highest education level: Not on file  Occupational History  . Not on file  Tobacco Use  . Smoking status: Current Every Day Smoker    Packs/day: 1.00    Years: 1.00    Pack years: 1.00    Types: Cigarettes  . Smokeless tobacco: Never Used  Substance and Sexual Activity  . Alcohol use: Not Currently    Comment: 1 year ago was drinking regularly.  No alcohol in the last 6 months.  (10/09/19)  . Drug use: Yes    Types: Marijuana    Comment: once a while  . Sexual activity: Yes  Other Topics Concern  . Not on file  Social History Narrative  . Not on file   Social Determinants of Health   Financial Resource Strain:   . Difficulty of Paying Living Expenses:   Food Insecurity:   . Worried About Charity fundraiser in the Last Year:   . Arboriculturist in the Last Year:   Transportation Needs:   . Film/video editor (Medical):   Marland Kitchen Lack of Transportation (Non-Medical):   Physical Activity:   . Days of Exercise per Week:   . Minutes of Exercise per Session:   Stress:   . Feeling of Stress :   Social Connections:   . Frequency of Communication with Friends and Family:   . Frequency of Social Gatherings with Friends and Family:   . Attends Religious Services:   . Active Member of Clubs or Organizations:   . Attends Archivist Meetings:   Marland Kitchen Marital Status:   Intimate Partner Violence:   . Fear of Current or Ex-Partner:   . Emotionally Abused:   Marland Kitchen Physically Abused:   . Sexually Abused:      Physical Exam: BP 128/76   Pulse 72   Temp 98.7 F (37.1 C)   Ht 5'  8" (1.727 m)   Wt 187 lb (84.8 kg)   BMI 28.43 kg/m  Constitutional: generally well-appearing Psychiatric: alert and oriented x3 Eyes: extraocular movements intact Mouth: oral pharynx moist, no lesions Neck: supple no lymphadenopathy Cardiovascular: heart regular rate and rhythm Lungs: clear to auscultation bilaterally Abdomen: soft, nontender, nondistended, no obvious ascites, no peritoneal signs, normal bowel sounds Extremities: no lower extremity edema bilaterally Skin: no lesions on visible extremities   Assessment and plan: 49 y.o. 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 They aspirated fluid, cyst fluid testing was  all innocent, see that summarized above. I am not surprised that the cyst has recurred as that is the natural history unless the cyst is completely resected surgically. He has a variety of abdominal pains, I doubt any of them are from the pancreatic cyst but it is very difficult to know with any certainty. The cyst is probably larger than was when it was aspirated 3 years ago in [place]. I recommended repeat endoscopic ultrasound with aspiration of the pancreatic cyst fluid. I would send it for cytology, checking for mucinous cells, checking for CEA and amylase. They will also be instructed to know if completely removing the fluid eradicates any of his abdominal pains. If that is the case and that would certainly be an argument for having it resected surgically.  His other issues including his GI bleeding, LFT abnormalities I will leave to his [place] [facility] team for further evaluation.    Please see the "Patient Instructions" section for addition details about the plan.   Rob Bunting, MD [facility] 10/13/2019, 10:16 AM  Cc: Augusto Gamble MD   Total time on date of encounter was 45 minutes (this  included time spent preparing to see the patient reviewing records; obtaining and/or reviewing separately obtained history; performing a medically appropriate exam and/or evaluation; counseling and educating the patient and family if present; ordering medications, tests or procedures if applicable; and documenting clinical information in the health record).

## 2019-10-13 NOTE — Progress Notes (Signed)
No evidence of Hepatitis B or C. Iron panel within normal limits. Nothing to suggest hereditary hemochromatosis. As we discussed at his OV, suspect elevated LFTs may be related to prior alcohol use as they are trending down compared to 1 year ago. Could also be influenced by Kratom.   Continue with recommendations made at time of OV including continuing to avoid alcohol, discontinue kratom and marijuana use, and avoid Tylenol and all other OTC supplements. We will follow-up on this at his next office visit to see if LFTs continue to improve.

## 2019-10-13 NOTE — H&P (View-Only) (Signed)
HPI: This is a very pleasant 49 year old man who was referred by [facility] gastroenterology Dr. Jena Gauss for pancreatic cyst  Sounds like he was having intermittent abdominal pains 3 to 4 years ago while living in [place] when the pancreatic cyst was discovered initially. He underwent endoscopic ultrasound, see those results I will summarize below.  He has never been told he had acute pancreatitis. Lately he has been abusing alcohol but not previously. His weight is overall stable. Pancreatic cancer does not run in his family.  He has a variety of abdominal pains right lower quadrant, right upper quadrant, epigastric, left mid quadrant. These are intermittent.  He was seen by gastroenterologist in [place], they are referring him to general surgery to discuss his umbilical hernia, consider repair.  They were working up his elevated liver tests.  They were arranging for a colonoscopy in the near future for intermittent rectal bleeding.  They sent him here for evaluation of his pancreatic cyst.  Old Data Reviewed: IMPRESSION: 1. No acute abnormality. 2. There is a 5.2 cm cystic mass involving the pancreas as detailed above. 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 A 6 month follow-up CT is recommended to confirm stability or resolution. 3. Bilateral nonobstructing nephrolithiasis. 4. Normal appendix. 5. Mild diffuse bladder wall thickening is noted. Correlation with urinalysis is recommended. 6. There is scattered colonic diverticula without CT evidence for Diverticulitis.  Pancreas: There is a cystic appearing mass involving the distal pancreatic body/pancreatic tail measuring approximately 5.2 x 4.9 cm. There is no CT evidence for pancreatitis. There may be a few  additional smaller cysts in the pancreatic tail.    Blood work March 2017 normal CBC, complete metabolic profile is normal except for ALT 85.  A battery of other liver testing was recently added  on.   [facility] everywehere review: GD/EUS with FNA 12/18/2016 with Dr. 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  Remainder of the pancreas appeared normal.  EGD with normal-appearing esophagus, stomach s/p random antral biopsies, normal duodenum.  Pathology of cyst aspirate negative for malignant cells.  Gastric antral biopsies benign. Lipase level was 4747, CEA level was 0.2.  CA 19-9 serum around that time was 12    Review of systems: Pertinent positive and negative review of systems were noted in the above HPI section. All other review negative.   Past Medical History:  Diagnosis Date  . Alcohol abuse   . Anxiety   . Depression   . Insomnia   . PTSD (post-traumatic stress disorder)   . RLS (restless legs syndrome)   . Seizures (HCC)    per patient, secondary to lack of sleep. No medications. Reports he just stares off.   . Sleep apnea   . Stomach ulcer 1996   Patient reports having emergent abdominal surgery    Past Surgical History:  Procedure Laterality Date  . ABDOMINAL SURGERY  1996   Per patient, this was emergency surgery secondary to peptic ulcer disease with significant bleeding  . ESOPHAGOGASTRODUODENOSCOPY  12/18/2016   [place]; Dr. Lowry Bowl; normal-appearing esophagus, normal stomach s/p random antral biopsies, normal duodenum.  Biopsies were benign.  . EUS  12/18/2016   [place]; Dr. Lowry Bowl; 3.5 cm pancreatic tail cyst with no solid components or nodules s/p aspiration.  Remainder of the pancreas appeared normal.  Cyst aspirate negative for malignant cells.  Marland Kitchen GANGLION CYST EXCISION    . KNEE SURGERY      Current  Outpatient Medications  Medication Sig Dispense Refill  . rOPINIRole (REQUIP) 4 MG tablet Take 4 mg by mouth at bedtime.     No current facility-administered medications for this visit.    Allergies as of 10/13/2019  . (No Known Allergies)    Family History  Problem Relation Age of Onset  .  Hyperlipidemia Mother   . Hypertension Mother   . Depression Mother   . Anxiety disorder Mother   . Healthy Father   . Pancreatic cancer Neg Hx        family history is limited  . Colon cancer Neg Hx        family history is limited    Social History   Socioeconomic History  . Marital status: Single    Spouse name: Not on file  . Number of children: 2  . Years of education: Not on file  . Highest education level: Not on file  Occupational History  . Not on file  Tobacco Use  . Smoking status: Current Every Day Smoker    Packs/day: 1.00    Years: 1.00    Pack years: 1.00    Types: Cigarettes  . Smokeless tobacco: Never Used  Substance and Sexual Activity  . Alcohol use: Not Currently    Comment: 1 year ago was drinking regularly.  No alcohol in the last 6 months.  (10/09/19)  . Drug use: Yes    Types: Marijuana    Comment: once a while  . Sexual activity: Yes  Other Topics Concern  . Not on file  Social History Narrative  . Not on file   Social Determinants of Health   Financial Resource Strain:   . Difficulty of Paying Living Expenses:   Food Insecurity:   . Worried About Charity fundraiser in the Last Year:   . Arboriculturist in the Last Year:   Transportation Needs:   . Film/video editor (Medical):   Marland Kitchen Lack of Transportation (Non-Medical):   Physical Activity:   . Days of Exercise per Week:   . Minutes of Exercise per Session:   Stress:   . Feeling of Stress :   Social Connections:   . Frequency of Communication with Friends and Family:   . Frequency of Social Gatherings with Friends and Family:   . Attends Religious Services:   . Active Member of Clubs or Organizations:   . Attends Archivist Meetings:   Marland Kitchen Marital Status:   Intimate Partner Violence:   . Fear of Current or Ex-Partner:   . Emotionally Abused:   Marland Kitchen Physically Abused:   . Sexually Abused:      Physical Exam: BP 128/76   Pulse 72   Temp 98.7 F (37.1 C)   Ht 5'  8" (1.727 m)   Wt 187 lb (84.8 kg)   BMI 28.43 kg/m  Constitutional: generally well-appearing Psychiatric: alert and oriented x3 Eyes: extraocular movements intact Mouth: oral pharynx moist, no lesions Neck: supple no lymphadenopathy Cardiovascular: heart regular rate and rhythm Lungs: clear to auscultation bilaterally Abdomen: soft, nontender, nondistended, no obvious ascites, no peritoneal signs, normal bowel sounds Extremities: no lower extremity edema bilaterally Skin: no lesions on visible extremities   Assessment and plan: 49 y.o. 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 They aspirated fluid, cyst fluid testing was  all innocent, see that summarized above. I am not surprised that the cyst has recurred as that is the natural history unless the cyst is completely resected surgically. He has a variety of abdominal pains, I doubt any of them are from the pancreatic cyst but it is very difficult to know with any certainty. The cyst is probably larger than was when it was aspirated 3 years ago in [place]. I recommended repeat endoscopic ultrasound with aspiration of the pancreatic cyst fluid. I would send it for cytology, checking for mucinous cells, checking for CEA and amylase. They will also be instructed to know if completely removing the fluid eradicates any of his abdominal pains. If that is the case and that would certainly be an argument for having it resected surgically.  His other issues including his GI bleeding, LFT abnormalities I will leave to his [place] [facility] team for further evaluation.    Please see the "Patient Instructions" section for addition details about the plan.   Macaulay Reicher, MD [facility] 10/13/2019, 10:16 AM  Cc: Mike Rourk MD   Total time on date of encounter was 45 minutes (this  included time spent preparing to see the patient reviewing records; obtaining and/or reviewing separately obtained history; performing a medically appropriate exam and/or evaluation; counseling and educating the patient and family if present; ordering medications, tests or procedures if applicable; and documenting clinical information in the health record).   

## 2019-10-19 ENCOUNTER — Telehealth: Payer: Self-pay

## 2019-10-19 NOTE — Telephone Encounter (Signed)
If he is having significant pain, lightheadedness, and nausea/vomiting he should proceed to the ED as they would need to rule out incarceration or other complications related to his hernia. We are not able to treat his hernia. We have placed referral to [facility]. I will have [facility] clinical pool follow-up on this to see if we can get him seen there ASAP.   [facility] Clinical Pool: Can we follow-up on referral to [facility]? Patient needs to be seen ASAP if possible.

## 2019-10-19 NOTE — Telephone Encounter (Signed)
VM received Sunday night 10/18/19 from pts significant other. She is requesting pt be seen in office.   Called pt today 10/19/19 @ 9:00 AM. Pt wants his surgical apt moved up. Pt states he was in pain all weekend at a level 10. Pt pain has eased up some and states that he can tolerate pain well. Pt pushed his hernia in several times this weekend and states it comes back out. When pushing the hernia in, pt feels lots of pain. Pt reports some nausea without vomiting. Pt is requesting an apt in the next day or so. Pt also reports some lightheadedness and has been told several times to go the ED with the symptoms of lightheadedness. Pt states the pain is affecting his daily activities and pt isn't able to work with the symptoms he is having.

## 2019-10-19 NOTE — Telephone Encounter (Signed)
Referral has been sent. It is showing as "sent" in proficient. Referral marked as urgent for [facility] to review. They can be reached at [phone]

## 2019-10-19 NOTE — Telephone Encounter (Signed)
Lmom, waiting on a return call.  

## 2019-10-19 NOTE — Telephone Encounter (Signed)
Spoke with pt. Pt notified that referral was marked as urgent and sent to [facility]. Pt was notified of [facility] recommendations. Pt is aware that if his symptoms worsen, he is to proceed to the ED.

## 2019-10-26 ENCOUNTER — Other Ambulatory Visit (HOSPITAL_COMMUNITY)
Admission: RE | Admit: 2019-10-26 | Discharge: 2019-10-26 | Disposition: A | Discharge status: Home / Self Care | Payer: Medicare Other | Source: Ambulatory Visit | Attending: Gastroenterology | Admitting: Gastroenterology

## 2019-10-26 DIAGNOSIS — Z20822 Contact with and (suspected) exposure to covid-19: Secondary | ICD-10-CM | POA: Diagnosis not present

## 2019-10-26 DIAGNOSIS — Z01812 Encounter for preprocedural laboratory examination: Secondary | ICD-10-CM | POA: Diagnosis present

## 2019-10-26 LAB — SARS CORONAVIRUS 2 (TAT 6-24 HRS): SARS Coronavirus 2: NEGATIVE

## 2019-10-28 NOTE — Progress Notes (Signed)
Called for pre procedure screening. Unable to leave message.

## 2019-10-29 ENCOUNTER — Other Ambulatory Visit: Payer: Self-pay

## 2019-10-29 ENCOUNTER — Encounter (HOSPITAL_COMMUNITY): Payer: Self-pay | Admitting: Gastroenterology

## 2019-10-29 ENCOUNTER — Encounter (HOSPITAL_COMMUNITY)
Admission: RE | Disposition: A | Discharge status: Home / Self Care | Payer: Self-pay | Source: Home / Self Care | Attending: Gastroenterology

## 2019-10-29 ENCOUNTER — Ambulatory Visit (HOSPITAL_COMMUNITY)
Admission: RE | Admit: 2019-10-29 | Discharge: 2019-10-29 | Disposition: A | Discharge status: Home / Self Care | Payer: Medicare Other | Attending: Gastroenterology | Admitting: Gastroenterology

## 2019-10-29 ENCOUNTER — Ambulatory Visit (HOSPITAL_COMMUNITY): Payer: Medicare Other | Admitting: Registered Nurse

## 2019-10-29 DIAGNOSIS — Z79899 Other long term (current) drug therapy: Secondary | ICD-10-CM | POA: Diagnosis not present

## 2019-10-29 DIAGNOSIS — Z8711 Personal history of peptic ulcer disease: Secondary | ICD-10-CM | POA: Diagnosis not present

## 2019-10-29 DIAGNOSIS — K5730 Diverticulosis of large intestine without perforation or abscess without bleeding: Secondary | ICD-10-CM | POA: Insufficient documentation

## 2019-10-29 DIAGNOSIS — G4700 Insomnia, unspecified: Secondary | ICD-10-CM | POA: Diagnosis not present

## 2019-10-29 DIAGNOSIS — F419 Anxiety disorder, unspecified: Secondary | ICD-10-CM | POA: Diagnosis not present

## 2019-10-29 DIAGNOSIS — Z8349 Family history of other endocrine, nutritional and metabolic diseases: Secondary | ICD-10-CM | POA: Insufficient documentation

## 2019-10-29 DIAGNOSIS — G2581 Restless legs syndrome: Secondary | ICD-10-CM | POA: Diagnosis not present

## 2019-10-29 DIAGNOSIS — K862 Cyst of pancreas: Secondary | ICD-10-CM | POA: Insufficient documentation

## 2019-10-29 DIAGNOSIS — F4310 Post-traumatic stress disorder, unspecified: Secondary | ICD-10-CM | POA: Insufficient documentation

## 2019-10-29 DIAGNOSIS — R569 Unspecified convulsions: Secondary | ICD-10-CM | POA: Insufficient documentation

## 2019-10-29 DIAGNOSIS — Z818 Family history of other mental and behavioral disorders: Secondary | ICD-10-CM | POA: Insufficient documentation

## 2019-10-29 DIAGNOSIS — N200 Calculus of kidney: Secondary | ICD-10-CM | POA: Insufficient documentation

## 2019-10-29 DIAGNOSIS — Z8249 Family history of ischemic heart disease and other diseases of the circulatory system: Secondary | ICD-10-CM | POA: Diagnosis not present

## 2019-10-29 DIAGNOSIS — F329 Major depressive disorder, single episode, unspecified: Secondary | ICD-10-CM | POA: Diagnosis not present

## 2019-10-29 DIAGNOSIS — R1084 Generalized abdominal pain: Secondary | ICD-10-CM | POA: Diagnosis not present

## 2019-10-29 DIAGNOSIS — G4730 Sleep apnea, unspecified: Secondary | ICD-10-CM | POA: Insufficient documentation

## 2019-10-29 DIAGNOSIS — F17210 Nicotine dependence, cigarettes, uncomplicated: Secondary | ICD-10-CM | POA: Insufficient documentation

## 2019-10-29 HISTORY — PX: EUS: SHX5427

## 2019-10-29 HISTORY — PX: FINE NEEDLE ASPIRATION: SHX5430

## 2019-10-29 HISTORY — PX: ESOPHAGOGASTRODUODENOSCOPY (EGD) WITH PROPOFOL: SHX5813

## 2019-10-29 LAB — PANC CYST FLD ANLYS-PATHFNDR-TG

## 2019-10-29 SURGERY — ESOPHAGOGASTRODUODENOSCOPY (EGD) WITH PROPOFOL
Anesthesia: Monitor Anesthesia Care

## 2019-10-29 MED ORDER — CIPROFLOXACIN IN D5W 400 MG/200ML IV SOLN
INTRAVENOUS | Status: AC
  Filled 2019-10-29: qty 200

## 2019-10-29 MED ORDER — PROPOFOL 500 MG/50ML IV EMUL
INTRAVENOUS | Status: AC
  Filled 2019-10-29: qty 50

## 2019-10-29 MED ORDER — GLYCOPYRROLATE 0.2 MG/ML IJ SOLN
INTRAMUSCULAR | Status: DC | PRN
  Administered 2019-10-29: .1 mg via INTRAVENOUS

## 2019-10-29 MED ORDER — SODIUM CHLORIDE 0.9 % IV SOLN: INTRAVENOUS | Status: DC

## 2019-10-29 MED ORDER — LACTATED RINGERS IV SOLN
INTRAVENOUS | Status: DC
  Administered 2019-10-29: 1000 mL via INTRAVENOUS

## 2019-10-29 MED ORDER — CIPROFLOXACIN IN D5W 400 MG/200ML IV SOLN
INTRAVENOUS | Status: DC | PRN
  Administered 2019-10-29: 400 mg via INTRAVENOUS

## 2019-10-29 MED ORDER — CIPROFLOXACIN HCL 500 MG PO TABS: 500.0000 mg | ORAL_TABLET | Freq: Two times a day (BID) | ORAL | 0 refills | Status: AC

## 2019-10-29 MED ORDER — PROPOFOL 500 MG/50ML IV EMUL
INTRAVENOUS | Status: DC | PRN
  Administered 2019-10-29: 500 ug/kg/min via INTRAVENOUS

## 2019-10-29 NOTE — Op Note (Signed)
[facility] ([facility]) Patient Name: Andrew Parks Procedure Date: 10/29/2019 MRN: [id number] Attending MD: Milus Banister , MD Date of Birth: [birth year] CSN: [id number] Age: 49 Admit Type: Outpatient Procedure:                Upper EUS Indications:              Pancreatic cyst on CT scan 5.2cm now; EUS with FNA                            5/29/2018with Dr. Delena [facility] [employer]                            [place] revealing 3.5 cm pancreatic tail cyst                            with no solid components or nodules s/p aspiration.                            Remainder of the pancreas appeared normal. EGD                            with normal-appearing esophagus, stomach s/p random                            antral biopsies, normal duodenum. Pathology of                            cyst aspirate negative for malignant cells.                            Gastric antral biopsies benign. Lipase level was                            4747, CEA level was 0.2. CA 19-9 serum around that                            time was 12 Providers:                Milus Banister, MD, Carlyn Reichert, RN, Cherylynn Ridges, Technician, Enrigue Catena, CRNA Referring MD:             Milton Ferguson, MD Medicines:                Monitored Anesthesia Care, Cipro 086 mg IV Complications:            No immediate complications. Estimated blood loss:                            None. Estimated Blood Loss:     Estimated blood loss: none. Procedure:                Pre-Anesthesia Assessment:                           -  Prior to the procedure, a History and Physical                            was performed, and patient medications and                            allergies were reviewed. The patient's tolerance of                            previous anesthesia was also reviewed. The risks                            and benefits of the procedure and the sedation                            options and  risks were discussed with the patient.                            All questions were answered, and informed consent                            was obtained. Prior Anticoagulants: The patient has                            taken no previous anticoagulant or antiplatelet                            agents. ASA Grade Assessment: II - A patient with                            mild systemic disease. After reviewing the risks                            and benefits, the patient was deemed in                            satisfactory condition to undergo the procedure.                           After obtaining informed consent, the endoscope was                            passed under direct vision. Throughout the                            procedure, the patient's blood pressure, pulse, and                            oxygen saturations were monitored continuously. The                            [id number]-[id number] ([id number]) Olympus Radial EUS was  introduced through the mouth, and advanced to the                            second part of duodenum. The [id number] ([id number])                            Olympus Radial EUS was introduced through the                            mouth, and advanced to the second part of duodenum.                            The upper EUS was accomplished without difficulty.                            The patient tolerated the procedure well. Scope In: Scope Out: Findings:      ENDOSCOPIC FINDING (limited views with radial and linear       echoendoscopes): :      The examined esophagus was endoscopically normal.      The entire examined stomach was endoscopically normal.      The examined duodenum was endoscopically normal.      ENDOSONOGRAPHIC FINDING: :      1. An anechoic lesion suggestive of a cyst was identified in the       pancreatic tail. It is not in obvious communication with the pancreatic       duct. The lesion measured 53 mm in maximal  cross-sectional diameter.       There was a single compartment without septae. There was no associated       solid mass. There was no internal debris within the fluid-filled cavity.       Diagnostic and therapeutic needle aspiration for fluid was performed.       Color Doppler imaging was utilized prior to needle puncture to confirm a       lack of significant vascular structures within the needle path. One pass       was made with the 19 gauge needle using a transgastric approach. The       amount of fluid collected was 60 mL (nearly all of the cyst fluid was       removed). The fluid was yellow and thin. Sample(s) were sent for amylase       concentration, cytology and CEA.      2. There were 3-4 very small cysts in the extreme tail of the pancreas,       appreciated best after large cyst aspiration above. These other cysts       had no assoicated solid component and mesured 3-87mm each.      3. Pancreatic parenchyma was otherwise normal.      4. Main pancreatic duct was normal.      5. CBD was normal.      6. Limited views of the liver, spleen, portal and splenic vessels were       all normal. Impression:               - Enlarging cyst in tail of pancreas (compared to  2018 [place] EUS described above). This MAY be                            causing some of his upper abdominal sypmtoms. The                            cyst currently measures 5.3cm and is otherwise very                            innocent appearing (no associated solid mass,                            normal main pancreatic duct). 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 Moderate Sedation:      Not Applicable - Patient had care per Anesthesia. Recommendation:           - Discharge patient to home (ambulatory).                           - He will complete three days of twice daily cipro. Procedure Code(s):        --- Professional ---                           [phone], Esophagogastroduodenoscopy, flexible,                            transoral; with transendoscopic ultrasound-guided                            intramural or transmural fine needle                            aspiration/biopsy(s), (includes endoscopic                            ultrasound examination limited to the esophagus,  stomach or duodenum, and adjacent structures) Diagnosis Code(s):        --- Professional ---                           K86.2, Cyst of pancreas CPT copyright 2019 American Medical Association. All rights reserved. The codes documented in this report are preliminary and upon coder review may  be revised to meet current compliance requirements. Rachael Fee, MD 10/29/2019 9:51:24 AM This report has been signed electronically. Number of Addenda: 0

## 2019-10-29 NOTE — Interval H&P Note (Signed)
History and Physical Interval Note:  10/29/2019 8:20 AM  Andrew Parks  has presented today for surgery, with the diagnosis of pancreatic cyst.  The various methods of treatment have been discussed with the patient and family. After consideration of risks, benefits and other options for treatment, the patient has consented to  Procedure(s): UPPER ESOPHAGEAL ENDOSCOPIC ULTRASOUND (EUS) (N/A) as a surgical intervention.  The patient's history has been reviewed, patient examined, no change in status, stable for surgery.  I have reviewed the patient's chart and labs.  Questions were answered to the patient's satisfaction.     Rachael Fee

## 2019-10-29 NOTE — Anesthesia Procedure Notes (Addendum)
Procedure Name: MAC Date/Time: 10/29/2019 9:10 AM Performed by: Lissa Morales, CRNA Pre-anesthesia Checklist: Patient identified, Emergency Drugs available, Suction available, Patient being monitored and Timeout performed Patient Re-evaluated:Patient Re-evaluated prior to induction Oxygen Delivery Method: Simple face mask Placement Confirmation: positive ETCO2

## 2019-10-29 NOTE — Discharge Instructions (Signed)
YOU HAD AN ENDOSCOPIC PROCEDURE TODAY: Refer to the procedure report and other information in the discharge instructions given to you for any specific questions about what was found during the examination. If this information does not answer your questions, please call [place] office at [phone] to clarify.   YOU SHOULD EXPECT: Some feelings of bloating in the abdomen. Passage of more gas than usual. Walking can help get rid of the air that was put into your GI tract during the procedure and reduce the bloating. If you had a lower endoscopy (such as a colonoscopy or flexible sigmoidoscopy) you may notice spotting of blood in your stool or on the toilet paper. Some abdominal soreness may be present for a day or two, also.  DIET: Your first meal following the procedure should be a light meal and then it is ok to progress to your normal diet. A half-sandwich or bowl of soup is an example of a good first meal. Heavy or fried foods are harder to digest and may make you feel nauseous or bloated. Drink plenty of fluids but you should avoid alcoholic beverages for 24 hours. If you had a esophageal dilation, please see attached instructions for diet.    ACTIVITY: Your care partner should take you home directly after the procedure. You should plan to take it easy, moving slowly for the rest of the day. You can resume normal activity the day after the procedure however YOU SHOULD NOT DRIVE, use power tools, machinery or perform tasks that involve climbing or major physical exertion for 24 hours (because of the sedation medicines used during the test).   SYMPTOMS TO REPORT IMMEDIATELY: A gastroenterologist can be reached at any hour. Please call [phone]  for any of the following symptoms:   Following upper endoscopy (EGD, EUS, ERCP, esophageal dilation) Vomiting of blood or coffee ground material  New, significant abdominal pain  New, significant chest pain or pain under the shoulder blades  Painful or  persistently difficult swallowing  New shortness of breath  Black, tarry-looking or red, bloody stools  FOLLOW UP:  If any biopsies were taken you will be contacted by phone or by letter within the next 1-3 weeks. Call [phone]  if you have not heard about the biopsies in 3 weeks.  Please also call with any specific questions about appointments or follow up tests.  

## 2019-10-29 NOTE — Anesthesia Postprocedure Evaluation (Signed)
Anesthesia Post Note  Patient: Andrew Parks  Procedure(s) Performed: ESOPHAGOGASTRODUODENOSCOPY (EGD) WITH PROPOFOL (N/A ) UPPER ENDOSCOPIC ULTRASOUND (EUS) LINEAR (N/A ) UPPER ENDOSCOPIC ULTRASOUND (EUS) RADIAL (N/A ) FINE NEEDLE ASPIRATION (FNA) LINEAR (N/A )     Patient location during evaluation: PACU Anesthesia Type: MAC Level of consciousness: awake and alert Pain management: pain level controlled Vital Signs Assessment: post-procedure vital signs reviewed and stable Respiratory status: spontaneous breathing, nonlabored ventilation, respiratory function stable and patient connected to nasal cannula oxygen Cardiovascular status: stable and blood pressure returned to baseline Postop Assessment: no apparent nausea or vomiting Anesthetic complications: no    Last Vitals:  Vitals:   10/29/19 0952 10/29/19 1000  BP: 91/61   Pulse: 78 88  Resp: (!) 23 19  Temp: (!) 36.2 C (P) 37.3 C  SpO2: 96% 98%    Last Pain:  Vitals:   10/29/19 1000  TempSrc: (P) Temporal  PainSc:                  Remonia Otte S

## 2019-10-29 NOTE — Transfer of Care (Signed)
Immediate Anesthesia Transfer of Care Note  Patient: Andrew Parks  Procedure(s) Performed: ESOPHAGOGASTRODUODENOSCOPY (EGD) WITH PROPOFOL (N/A ) UPPER ENDOSCOPIC ULTRASOUND (EUS) LINEAR (N/A ) UPPER ENDOSCOPIC ULTRASOUND (EUS) RADIAL (N/A ) FINE NEEDLE ASPIRATION (FNA) LINEAR (N/A )  Patient Location: PACU  Anesthesia Type:MAC  Level of Consciousness: awake and patient cooperative  Airway & Oxygen Therapy: Patient Spontanous Breathing and Patient connected to face mask oxygen  Post-op Assessment: Report given to RN, Post -op Vital signs reviewed and stable and Patient moving all extremities X 4  Post vital signs: stable  Last Vitals:  Vitals Value Taken Time  BP 91/61 10/29/19 0953  Temp 36.2 C 10/29/19 0952  Pulse 70 10/29/19 0957  Resp 27 10/29/19 0957  SpO2 100 % 10/29/19 0957  Vitals shown include unvalidated device data.  Last Pain:  Vitals:   10/29/19 0952  TempSrc: Temporal  PainSc: 0-No pain         Complications: No apparent anesthesia complications

## 2019-10-29 NOTE — Anesthesia Preprocedure Evaluation (Signed)
Anesthesia Evaluation  Patient identified by MRN, date of birth, ID band Patient awake    Reviewed: Allergy & Precautions, NPO status , Patient's Chart, lab work & pertinent test results  Airway Mallampati: II  TM Distance: >3 FB Neck ROM: Full    Dental no notable dental hx.    Pulmonary Current Smoker,    Pulmonary exam normal breath sounds clear to auscultation       Cardiovascular negative cardio ROS Normal cardiovascular exam Rhythm:Regular Rate:Normal     Neuro/Psych negative neurological ROS  negative psych ROS   GI/Hepatic Neg liver ROS,   Endo/Other  negative endocrine ROS  Renal/GU negative Renal ROS  negative genitourinary   Musculoskeletal negative musculoskeletal ROS (+)   Abdominal   Peds negative pediatric ROS (+)  Hematology negative hematology ROS (+)   Anesthesia Other Findings   Reproductive/Obstetrics negative OB ROS                             Anesthesia Physical Anesthesia Plan  ASA: II  Anesthesia Plan: MAC   Post-op Pain Management:    Induction: Intravenous  PONV Risk Score and Plan: 0  Airway Management Planned: Simple Face Mask  Additional Equipment:   Intra-op Plan:   Post-operative Plan:   Informed Consent: I have reviewed the patients History and Physical, chart, labs and discussed the procedure including the risks, benefits and alternatives for the proposed anesthesia with the patient or authorized representative who has indicated his/her understanding and acceptance.     Dental advisory given  Plan Discussed with: CRNA and Surgeon  Anesthesia Plan Comments:         Anesthesia Quick Evaluation

## 2019-10-30 ENCOUNTER — Encounter: Payer: Self-pay | Admitting: Gastroenterology

## 2019-10-30 LAB — CYTOLOGY - NON PAP

## 2019-11-03 ENCOUNTER — Telehealth: Payer: Self-pay | Admitting: Gastroenterology

## 2019-11-03 NOTE — Telephone Encounter (Signed)
Rachael Fee, MD  Loretha Stapler, RN    Please call the patient. So far the fluid testing results show no sign of cancer. Await interpace diagnostic results (cea, amylase). Thanks

## 2019-11-03 NOTE — Telephone Encounter (Signed)
Patient returned your call about results, please call patient one more time.   

## 2019-11-03 NOTE — Telephone Encounter (Signed)
Left message on machine to call back  

## 2019-11-04 NOTE — Telephone Encounter (Signed)
Left message on machine to call back  

## 2019-11-04 NOTE — Telephone Encounter (Signed)
Patient returned your call about results. He stated that he will be available this morning. Please call him one more time.

## 2019-11-05 NOTE — Telephone Encounter (Signed)
Left message on machine to call back  

## 2019-11-06 NOTE — Telephone Encounter (Signed)
The patient has been notified of this information and all questions answered.

## 2019-11-06 NOTE — Telephone Encounter (Signed)
Unable to reach pt by phone letter mailed.  

## 2019-11-09 ENCOUNTER — Telehealth: Payer: Self-pay | Admitting: Gastroenterology

## 2019-11-09 NOTE — Telephone Encounter (Signed)
Pt's Fiance called back returning your call if you can leave a detailed msg on her  vm [phone].

## 2019-11-09 NOTE — Telephone Encounter (Signed)
[facility] pancreatic cyst fluid test analysis results were very encouraging.  CEA 4.8 ng/mL Amylase 429 units/L   This is suggestive of a very innocent cyst, perhaps a pseudocyst or SCA.  Ask how he feels since it has been drained.  thanks

## 2019-11-09 NOTE — Telephone Encounter (Signed)
Left message on machine to call back  

## 2019-11-09 NOTE — Telephone Encounter (Signed)
The pt wife has been advised and states she will have him call back to give an update.

## 2019-12-04 ENCOUNTER — Ambulatory Visit: Payer: Self-pay | Admitting: Surgery

## 2019-12-04 NOTE — H&P (Signed)
Andrew Parks Appointment: 12/04/2019 10:10 AM Location: [facility] Patient #: [id number] DOB: 02-26-1971 Married / Language: Andrew Parks / Race: White Male  History of Present Illness Andrew Parks A. Shayle Donahoo MD; 12/04/2019 1:11 PM) Patient words: Patient presents for evaluation of ventral hernia. He is a complex history with a complex cyst is been followed since 2018 initially in [place] and then now in [place] half removed here. He had this evaluated last month by Andrew Parks of gastrointestinal medicine after computed tomography scan showed a 5 cm simple cyst with detail the pancreas. He underwent endoscopic evaluation with aspiration and cytological evaluation which showed this to be a probable benign cyst. It appears to be a similar size cyst to that back in 2018 that was aspirated in [place] he states. He has hematochezia as being evaluated by gastrointestinal medicine. He has a history of alcohol abuse but is not drinking currently he states. He has a small hernia just above his umbilicus that he wants repaired causing mild to moderate discomfort especially when he is more active or lifting.  The patient is a 49 year old male.   Diagnostic Studies History Andrew Parks, CMA; 12/04/2019 10:09 AM) Colonoscopy never  Allergies Andrew Parks, CMA; 12/04/2019 10:09 AM) No Known Drug Allergies [12/04/2019]:  Medication History (Andrew Parks, CMA; 12/04/2019 10:09 AM) rOPINIRole HCl (4MG  Tablet, Oral) Active. Medications Reconciled  Social History Andrew Parks, CMA; 12/04/2019 10:09 AM) Caffeine use Carbonated beverages, Coffee. Illicit drug use Prefer to discuss with provider. No alcohol use Tobacco use Current every day smoker.  Family History Andrew Parks, [place]; 12/04/2019 10:09 AM) Alcohol Abuse Father.  Other Problems Andrew Parks, CMA; 12/04/2019 10:09 AM) Anxiety Disorder Chronic Obstructive Lung Disease Depression Inguinal  Hernia Sleep Apnea Umbilical Hernia Repair Ventral Hernia Repair     Review of Systems (Andrew Parks CMA; 12/04/2019 10:09 AM) General Not Present- Appetite Loss, Chills, Fatigue, Fever, Night Sweats, Weight Gain and Weight Loss. Skin Not Present- Change in Wart/Mole, Dryness, Hives, Jaundice, New Lesions, Non-Healing Wounds, Rash and Ulcer. HEENT Not Present- Earache, Hearing Loss, Hoarseness, Nose Bleed, Oral Ulcers, Ringing in the Ears, Seasonal Allergies, Sinus Pain, Sore Throat, Visual Disturbances, Wears glasses/contact lenses and Yellow Eyes. Respiratory Not Present- Bloody sputum, Chronic Cough, Difficulty Breathing, Snoring and Wheezing. Breast Not Present- Breast Mass, Breast Pain, Nipple Discharge and Skin Changes. Cardiovascular Not Present- Chest Pain, Difficulty Breathing Lying Down, Leg Cramps, Palpitations, Rapid Heart Rate, Shortness of Breath and Swelling of Extremities. Gastrointestinal Not Present- Abdominal Pain, Bloating, Bloody Stool, Change in Bowel Habits, Chronic diarrhea, Constipation, Difficulty Swallowing, Excessive gas, Gets full quickly at meals, Hemorrhoids, Indigestion, Nausea, Rectal Pain and Vomiting. Male Genitourinary Not Present- Blood in Urine, Change in Urinary Stream, Frequency, Impotence, Nocturia, Painful Urination, Urgency and Urine Leakage. Musculoskeletal Present- Back Pain and Joint Pain. Not Present- Joint Stiffness, Muscle Pain, Muscle Weakness and Swelling of Extremities. Neurological Present- Decreased Memory. Not Present- Fainting, Headaches, Numbness, Seizures, Tingling, Tremor, Trouble walking and Weakness. Psychiatric Present- Anxiety, Change in Sleep Pattern and Depression. Not Present- Bipolar, Fearful and Frequent crying. Endocrine Present- Heat Intolerance. Not Present- Cold Intolerance, Excessive Hunger, Hair Changes, Hot flashes and New Diabetes.  Vitals (Andrew Parks CMA; 12/04/2019 10:09 AM) 12/04/2019 10:09 AM Weight:  176.8 lb Height: 68in Body Surface Area: 1.94 m Body Mass Index: 26.88 kg/m  Temp.: 97.17F(Oral)  Pulse: 84 (Regular)  BP: 100/60(Sitting, Left Arm, Standard)        Physical Exam (Andrew Parks A. Andrew Quadros MD; 12/04/2019 1:11 PM)  General Mental Status-Alert. General Appearance-Consistent with stated age. Hydration-Well hydrated. Voice-Normal.  Eye Eyeball - Bilateral-Extraocular movements intact. Sclera/Conjunctiva - Bilateral-No scleral icterus.  Chest and Lung Exam Chest and lung exam reveals -quiet, even and easy respiratory effort with no use of accessory muscles and on auscultation, normal breath sounds, no adventitious sounds and normal vocal resonance. Inspection Chest Wall - Normal. Back - normal.  Cardiovascular Cardiovascular examination reveals -normal heart sounds, regular rate and rhythm with no murmurs and normal pedal pulses bilaterally.  Abdomen Note: Small reducible ventral hernia approximately 2 fingerbreadths above the umbilicus. Soft without rebound or guarding  Neurologic Neurologic evaluation reveals -alert and oriented x 3 with no impairment of recent or remote memory. Mental Status-Normal.  Musculoskeletal Normal Exam - Left-Upper Extremity Strength Normal and Lower Extremity Strength Normal. Normal Exam - Right-Upper Extremity Strength Normal and Lower Extremity Strength Normal.    Assessment & Plan (Andrew Parks A. Andrew Maffett MD; 12/04/2019 1:12 PM)  VENTRAL HERNIA WITHOUT OBSTRUCTION OR GANGRENE (K43.9) Impression: Very small hernia just above his umbilicus. He does not wish to use mesh and I explained pros and cons of mesh use a long-term implications of hernia recurrence without it. It is about 1 cm in size and therefore should be amenable to a primary repair were Discussed with him today as well. I reviewed his computed tomography scan and his other issues a pancreatic cyst that appears to be benign since been present  since 2017 undergoing recent evaluation and aspiration by Andrew Parks of gastrointestinal medicine. 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 I explained that hernia surgery will not make the pain better and he has other abdominal issues are being worked up by gastroenterology. He desires repair of his hernia. The risk of hernia repair include bleeding, infection, organ injury, bowel injury, bladder injury, nerve injury recurrent hernia, blood clots, worsening of underlying condition, chronic pain, mesh use, open surgery, death, and the need for other operations. Pt agrees to proceed    Total time 45 minutes for physical examination, time spent counseling, discussion of surgery, discussion of palpitations, chart review next review.  Current Plans You are being scheduled for surgery- Our schedulers will call you.  You should hear from our office's scheduling department within 5 working days about the location, date, and time of surgery. We try to make accommodations for patient's preferences in scheduling surgery, but sometimes the OR schedule or the surgeon's schedule prevents [place] from making those accommodations.  If you have not heard from our office [phone]) in 5 working days, call the office and ask for your surgeon's nurse.  If you have other questions about your diagnosis, plan, or surgery, call the office and ask for your surgeon's nurse.  Pt Education - Pamphlet Given - Hernia Surgery: discussed with patient and provided information.

## 2019-12-14 ENCOUNTER — Other Ambulatory Visit (HOSPITAL_COMMUNITY): Payer: Medicare Other

## 2019-12-15 ENCOUNTER — Inpatient Hospital Stay (HOSPITAL_COMMUNITY): Admission: RE | Admit: 2019-12-15 | Payer: Medicare Other | Source: Ambulatory Visit

## 2019-12-16 NOTE — Progress Notes (Signed)
Wendy at Dr Cornett's office notified we have not been able to reach pt.

## 2019-12-17 ENCOUNTER — Encounter (HOSPITAL_BASED_OUTPATIENT_CLINIC_OR_DEPARTMENT_OTHER): Payer: Self-pay | Admitting: Surgery

## 2019-12-17 ENCOUNTER — Ambulatory Visit (HOSPITAL_BASED_OUTPATIENT_CLINIC_OR_DEPARTMENT_OTHER): Admission: RE | Admit: 2019-12-17 | Payer: Medicare Other | Source: Home / Self Care | Admitting: Surgery

## 2019-12-17 ENCOUNTER — Encounter (HOSPITAL_COMMUNITY): Payer: Self-pay | Admitting: Anesthesiology

## 2019-12-17 ENCOUNTER — Encounter (HOSPITAL_BASED_OUTPATIENT_CLINIC_OR_DEPARTMENT_OTHER): Admission: RE | Payer: Self-pay | Source: Home / Self Care

## 2019-12-17 SURGERY — REPAIR, HERNIA, VENTRAL
Anesthesia: General

## 2019-12-17 MED ORDER — PROPOFOL 10 MG/ML IV BOLUS
INTRAVENOUS | Status: AC
  Filled 2019-12-17: qty 20

## 2019-12-24 NOTE — Patient Instructions (Signed)
Andrew Parks  12/24/2019     @PREFPERIOPPHARMACY @   Your procedure is scheduled on  12/31/2019.  Report to 03/01/2020 at  1130  A.M.  Call this number if you have problems the morning of surgery:  [phone]   Remember:  Follow the diet and prep instructions given to you by Dr [phone] office.                         Take these medicines the morning of surgery with A SIP OF WATER  None    Do not wear jewelry, make-up or nail polish.  Do not wear lotions, powders, or perfumes. Please wear deodorant and brush your teeth.  Do not shave 48 hours prior to surgery.  Men may shave face and neck.  Do not bring valuables to the hospital.  [facility] is not responsible for any belongings or valuables.  Contacts, dentures or bridgework may not be worn into surgery.  Leave your suitcase in the car.  After surgery it may be brought to your room.  For patients admitted to the hospital, discharge time will be determined by your treatment team.  Patients discharged the day of surgery will not be allowed to drive home.   Name and phone number of your driver:   family Special instructions:  DO NOT smoke the morning of your procedure.  Please read over the following fact sheets that you were given. Anesthesia Post-op Instructions and Care and Recovery After Surgery       Colonoscopy, Adult, Care After This sheet gives you information about how to care for yourself after your procedure. Your health care provider may also give you more specific instructions. If you have problems or questions, contact your health care provider. What can I expect after the procedure? After the procedure, it is common to have:  A small amount of blood in your stool for 24 hours after the procedure.  Some gas.  Mild cramping or bloating of your abdomen. Follow these instructions at home: Eating and drinking   Drink enough fluid to keep your urine pale yellow.  Follow instructions from your  health care provider about eating or drinking restrictions.  Resume your normal diet as instructed by your health care provider. Avoid heavy or fried foods that are hard to digest. Activity  Rest as told by your health care provider.  Avoid sitting for a long time without moving. Get up to take short walks every 1-2 hours. This is important to improve blood flow and breathing. Ask for help if you feel weak or unsteady.  Return to your normal activities as told by your health care provider. Ask your health care provider what activities are safe for you. Managing cramping and bloating   Try walking around when you have cramps or feel bloated.  Apply heat to your abdomen as told by your health care provider. Use the heat source that your health care provider recommends, such as a moist heat pack or a heating pad. ? Place a towel between your skin and the heat source. ? Leave the heat on for 20-30 minutes. ? Remove the heat if your skin turns bright red. This is especially important if you are unable to feel pain, heat, or cold. You may have a greater risk of getting burned. General instructions  For the first 24 hours after the procedure: ? Do not drive or use machinery. ? Do not  sign important documents. ? Do not drink alcohol. ? Do your regular daily activities at a slower pace than normal. ? Eat soft foods that are easy to digest.  Take over-the-counter and prescription medicines only as told by your health care provider.  Keep all follow-up visits as told by your health care provider. This is important. Contact a health care provider if:  You have blood in your stool 2-3 days after the procedure. Get help right away if you have:  More than a small spotting of blood in your stool.  Large blood clots in your stool.  Swelling of your abdomen.  Nausea or vomiting.  A fever.  Increasing pain in your abdomen that is not relieved with medicine. Summary  After the procedure,  it is common to have a small amount of blood in your stool. You may also have mild cramping and bloating of your abdomen.  For the first 24 hours after the procedure, do not drive or use machinery, sign important documents, or drink alcohol.  Get help right away if you have a lot of blood in your stool, nausea or vomiting, a fever, or increased pain in your abdomen. This information is not intended to replace advice given to you by your health care provider. Make sure you discuss any questions you have with your health care provider. Document Revised: 02/02/2019 Document Reviewed: 02/02/2019 Elsevier Patient Education  [place] After These instructions provide you with information about caring for yourself after your procedure. Your health care provider may also give you more specific instructions. Your treatment has been planned according to current medical practices, but problems sometimes occur. Call your health care provider if you have any problems or questions after your procedure. What can I expect after the procedure? After your procedure, you may:  Feel sleepy for several hours.  Feel clumsy and have poor balance for several hours.  Feel forgetful about what happened after the procedure.  Have poor judgment for several hours.  Feel nauseous or vomit.  Have a sore throat if you had a breathing tube during the procedure. Follow these instructions at home: For at least 24 hours after the procedure:      Have a responsible adult stay with you. It is important to have someone help care for you until you are awake and alert.  Rest as needed.  Do not: ? Participate in activities in which you could fall or become injured. ? Drive. ? Use heavy machinery. ? Drink alcohol. ? Take sleeping pills or medicines that cause drowsiness. ? Make important decisions or sign legal documents. ? Take care of children on your own. Eating and  drinking  Follow the diet that is recommended by your health care provider.  If you vomit, drink water, juice, or soup when you can drink without vomiting.  Make sure you have little or no nausea before eating solid foods. General instructions  Take over-the-counter and prescription medicines only as told by your health care provider.  If you have sleep apnea, surgery and certain medicines can increase your risk for breathing problems. Follow instructions from your health care provider about wearing your sleep device: ? Anytime you are sleeping, including during daytime naps. ? While taking prescription pain medicines, sleeping medicines, or medicines that make you drowsy.  If you smoke, do not smoke without supervision.  Keep all follow-up visits as told by your health care provider. This is important. Contact a health care provider  if:  You keep feeling nauseous or you keep vomiting.  You feel light-headed.  You develop a rash.  You have a fever. Get help right away if:  You have trouble breathing. Summary  For several hours after your procedure, you may feel sleepy and have poor judgment.  Have a responsible adult stay with you for at least 24 hours or until you are awake and alert. This information is not intended to replace advice given to you by your health care provider. Make sure you discuss any questions you have with your health care provider. Document Revised: 10/07/2017 Document Reviewed: 10/30/2015 Elsevier Patient Education  [place].

## 2019-12-29 ENCOUNTER — Encounter (HOSPITAL_COMMUNITY)
Admission: RE | Admit: 2019-12-29 | Discharge: 2019-12-29 | Disposition: A | Discharge status: Home / Self Care | Payer: Medicare Other | Source: Ambulatory Visit | Attending: Internal Medicine | Admitting: Internal Medicine

## 2019-12-29 ENCOUNTER — Other Ambulatory Visit (HOSPITAL_COMMUNITY): Payer: Medicare Other

## 2019-12-29 ENCOUNTER — Encounter (HOSPITAL_COMMUNITY): Payer: Self-pay

## 2019-12-29 ENCOUNTER — Telehealth: Payer: Self-pay | Admitting: *Deleted

## 2019-12-29 NOTE — Telephone Encounter (Signed)
-----   Message from Andrew Bolus, RN sent at 12/29/2019 12:42 PM EDT ----- [name] did not show up for his PAT today.  His procedure is scheduled for 12/31/2019

## 2019-12-29 NOTE — Telephone Encounter (Signed)
I had referred patient to [facility] for EUS. He still needs colonoscopy with [place] which is what is scheduled for 6/10.

## 2019-12-29 NOTE — Telephone Encounter (Signed)
Called mobile # and unable to get through Called home# and LMOVM

## 2019-12-29 NOTE — Telephone Encounter (Signed)
Looks like patient had an EGD 4/8 by [facility]. Please review [facility] if he still needs this by [place]? thanks

## 2019-12-30 ENCOUNTER — Encounter: Payer: Self-pay | Admitting: Internal Medicine

## 2019-12-30 NOTE — Telephone Encounter (Signed)
Spoke with endo. They have also not received a call from patient. Procedure has been cancelled for tomorrow. LMOVM for pt again. FYI to [facility] unable to reach patient

## 2019-12-30 NOTE — Telephone Encounter (Signed)
LMOVM

## 2019-12-30 NOTE — Telephone Encounter (Signed)
Noted. Please send letter for patient to call to schedule a follow-up appointment.

## 2019-12-31 ENCOUNTER — Encounter (HOSPITAL_COMMUNITY): Admission: RE | Payer: Self-pay | Source: Home / Self Care

## 2019-12-31 ENCOUNTER — Ambulatory Visit (HOSPITAL_COMMUNITY): Admission: RE | Admit: 2019-12-31 | Payer: Medicare Other | Source: Home / Self Care | Admitting: Internal Medicine

## 2019-12-31 SURGERY — COLONOSCOPY WITH PROPOFOL
Anesthesia: Monitor Anesthesia Care

## 2020-10-29 IMAGING — CT CT ABD-PELV W/ CM
2 of 5 series · 15 of 46 positions shown, 17 images · IV contrast (Omnipaque or Isovue)
Comparison: None.

CLINICAL DATA: Abdominal pain.  Hematochezia.

EXAM:
CT ABDOMEN AND PELVIS WITH CONTRAST
TECHNIQUE: Multidetector CT imaging of the abdomen and pelvis was performed
using the standard protocol following bolus administration of
intravenous contrast.
CONTRAST:  100mL OMNIPAQUE IOHEXOL 300 MG/ML  SOLN

[Series 2: axial st · axial · 0.72mm/px · z∈[+710,+1130]mm · 12 of 98 slices shown, 14 images]
[im 7/98  soft-tissue]
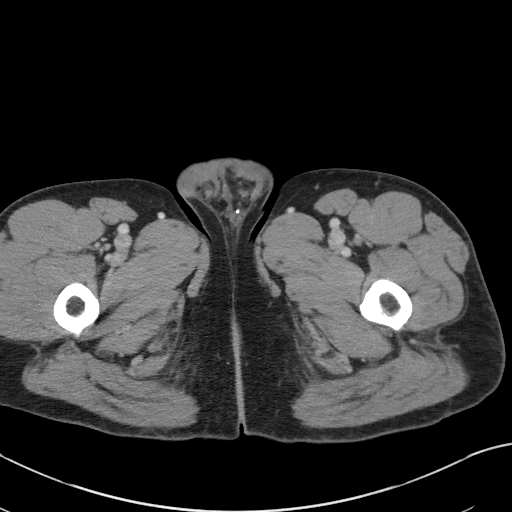
[im 7/98  bone]
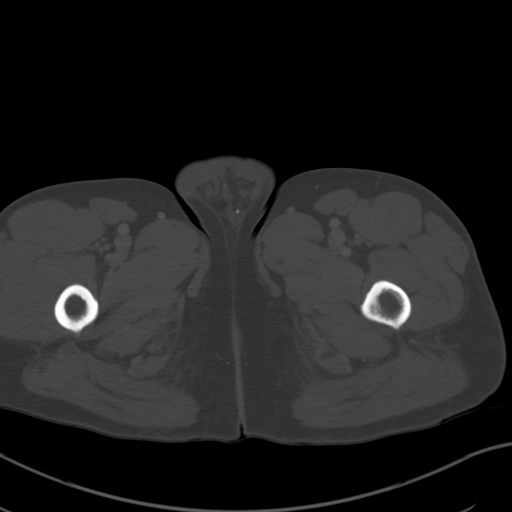
[im 14/98  soft-tissue]
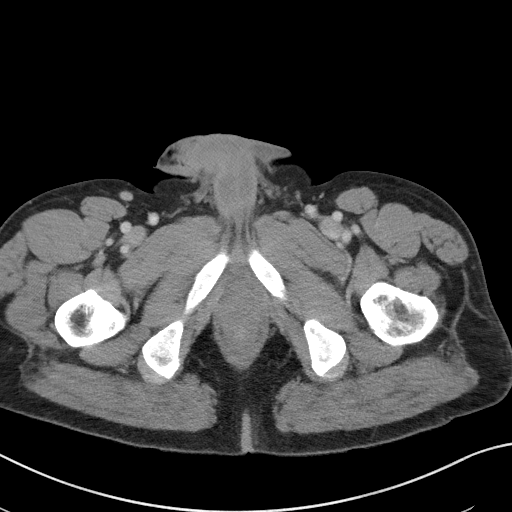
[im 21/98  soft-tissue]
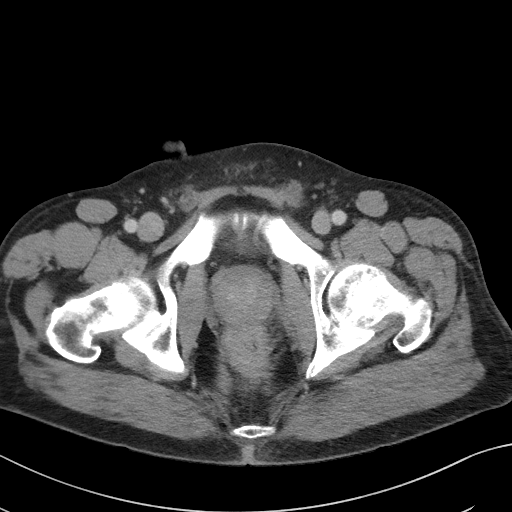
[im 28/98  soft-tissue]
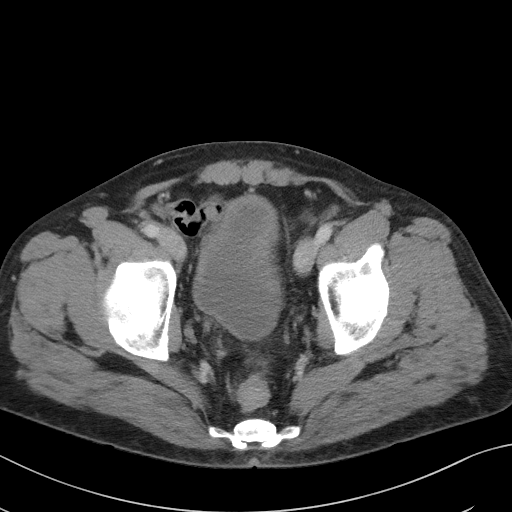
[im 35/98  soft-tissue]
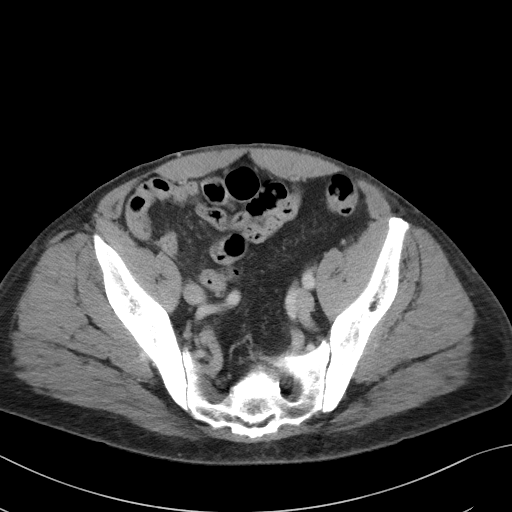
[im 42/98  soft-tissue]
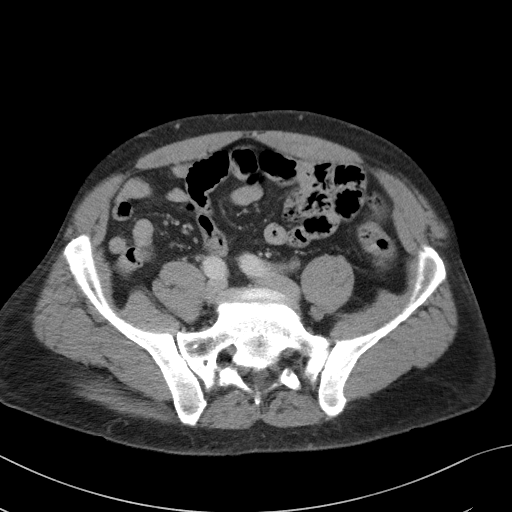
[im 56/98  soft-tissue]
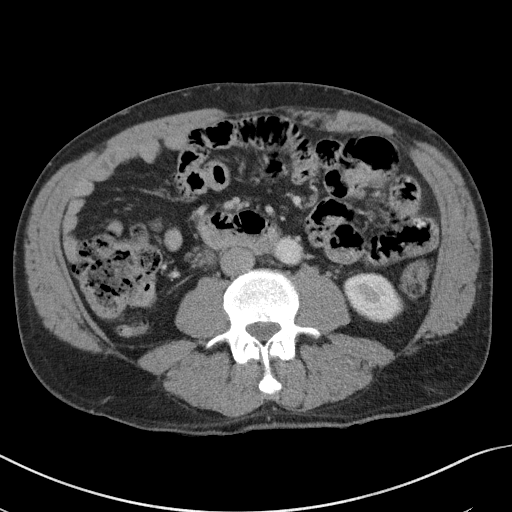
[im 63/98  soft-tissue]
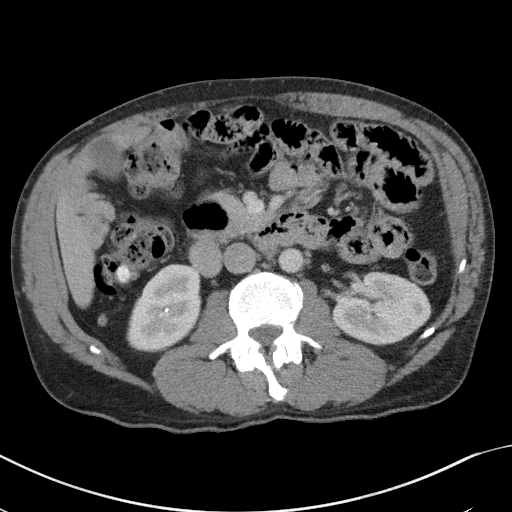
[im 70/98  soft-tissue]
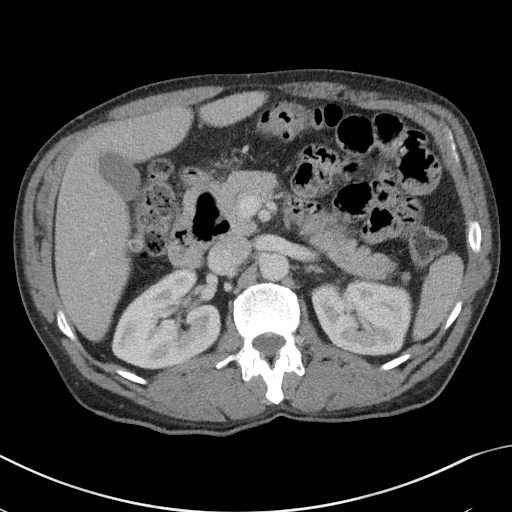
[im 70/98  bone]
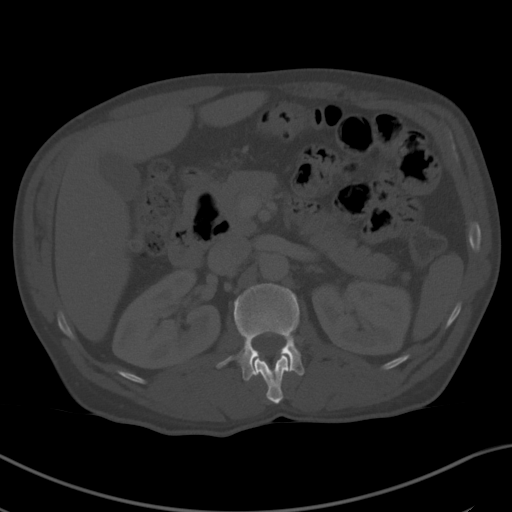
[im 77/98  soft-tissue]
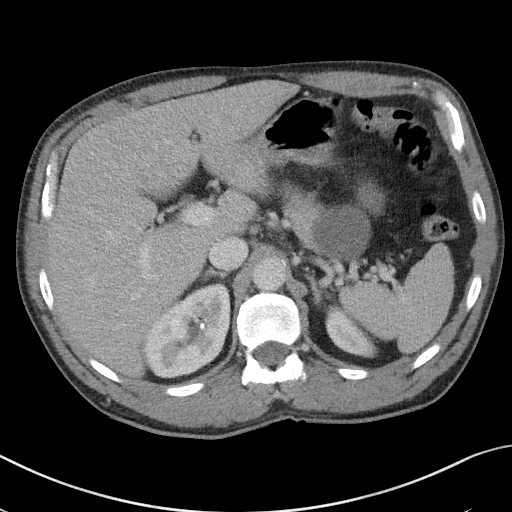
[im 84/98  soft-tissue]
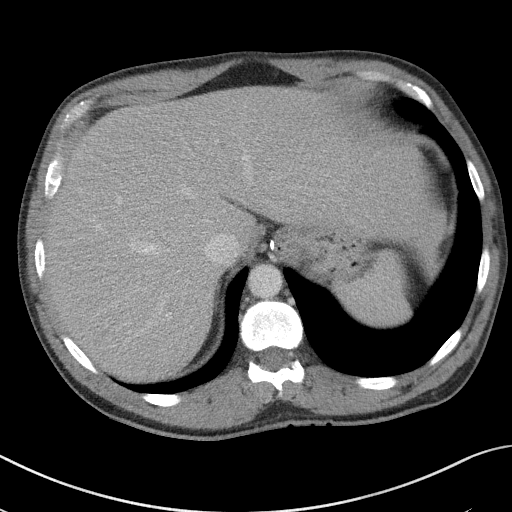
[im 91/98  soft-tissue]
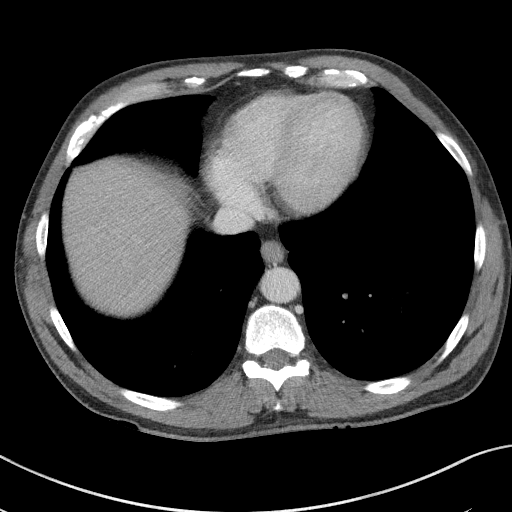

[Series 5: coronal st · coronal · 0.66mm/px · 3 of 95 slices shown]
[im 32/95  soft-tissue]
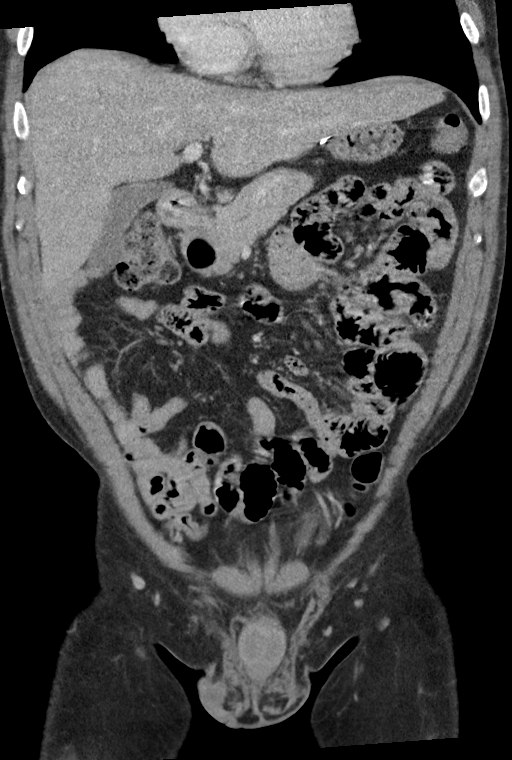
[im 42/95  soft-tissue]
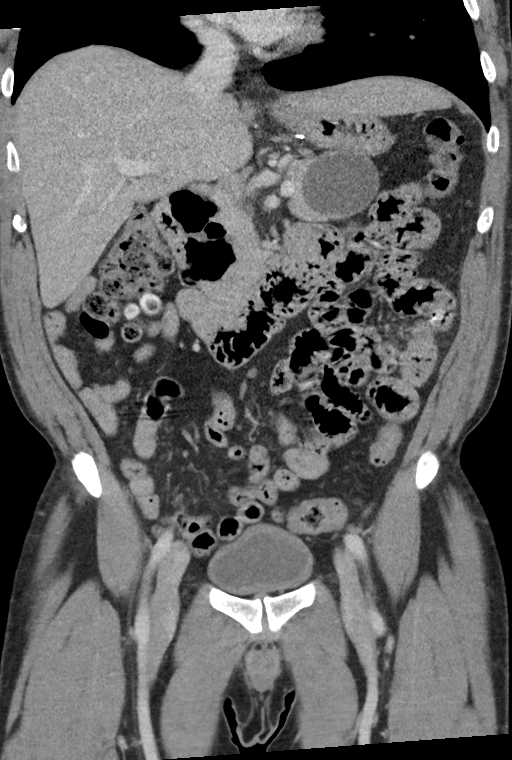
[im 53/95  soft-tissue]
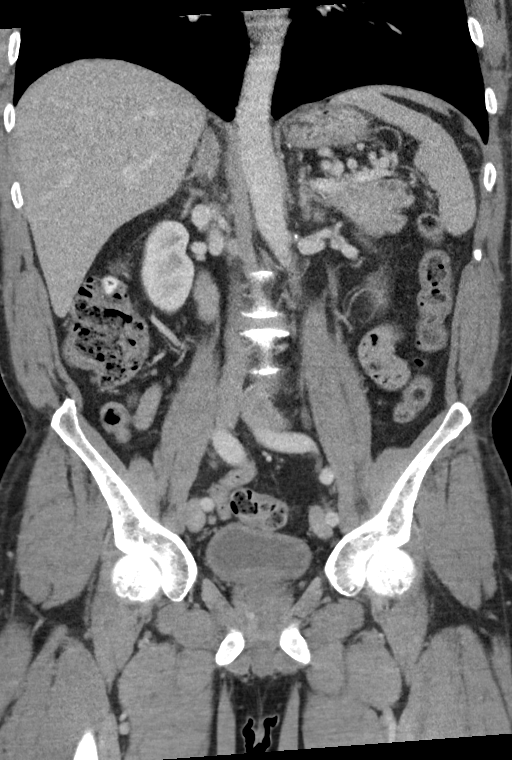

[15 of 46 positions shown; findings below may reference images not displayed]

FINDINGS: Lower chest: The lung bases are clear. The heart size is normal.

Hepatobiliary: The liver is normal. Normal gallbladder.There is no
biliary ductal dilation.

Pancreas: There is a cystic appearing mass involving the distal
pancreatic body/pancreatic tail measuring approximately 5.2 x
cm. There is no CT evidence for pancreatitis. There may be a few
additional smaller cysts in the pancreatic tail.

Spleen: No splenic laceration or hematoma.

Adrenals/Urinary Tract:

--Adrenal glands: No adrenal hemorrhage.

--Right kidney/ureter: There are multiple nonobstructing right-sided
kidney stones. There is no right-sided hydronephrosis.

--Left kidney/ureter: There are multiple left-sided kidney stones
without evidence for hydronephrosis.

--Urinary bladder: There is some mild diffuse bladder wall
thickening.

Stomach/Bowel:

--Stomach/Duodenum: There are surgical staples along the lesser
curvature of the stomach. There is a large duodenal diverticulum.

--Small bowel: No dilatation or inflammation.

--Colon: There is scattered colonic diverticula without CT evidence
for diverticulitis.

--Appendix: Normal.

Vascular/Lymphatic: Atherosclerotic calcification is present within
the non-aneurysmal abdominal aorta, without hemodynamically
significant stenosis.

--No retroperitoneal lymphadenopathy.

--No mesenteric lymphadenopathy.

--No pelvic or inguinal lymphadenopathy.

Reproductive: The prostate gland is enlarged.

Other: No ascites or free air. There is a small fat containing
umbilical hernia.

Musculoskeletal. No acute displaced fractures.
IMPRESSION: 1. No acute abnormality.
2. There is a 5.2 cm cystic mass involving the pancreas as detailed
above. This is favored to represent a pancreatic pseudocyst. A 6
month follow-up CT is recommended to confirm stability or
resolution.
3. Bilateral nonobstructing nephrolithiasis.
4. Normal appendix.
5. Mild diffuse bladder wall thickening is noted. Correlation with
urinalysis is recommended.
6. There is scattered colonic diverticula without CT evidence for
diverticulitis.

Aortic Atherosclerosis (JAACW-843.3).
